# Patient Record
Sex: Female | Born: 1950 | Race: White | Hispanic: No | Smoking: Former smoker
Health system: Southern US, Community
[De-identification: ages and names within clinical notes are randomized; demographics above are authoritative.]

## PROBLEM LIST (undated history)

## (undated) DIAGNOSIS — R519 Headache, unspecified: Secondary | ICD-10-CM

## (undated) DIAGNOSIS — R51 Headache: Secondary | ICD-10-CM

## (undated) DIAGNOSIS — M549 Dorsalgia, unspecified: Secondary | ICD-10-CM

## (undated) DIAGNOSIS — M797 Fibromyalgia: Secondary | ICD-10-CM

## (undated) DIAGNOSIS — A6920 Lyme disease, unspecified: Secondary | ICD-10-CM

## (undated) HISTORY — DX: Headache, unspecified: R51.9

## (undated) HISTORY — DX: Lyme disease, unspecified: A69.20

## (undated) HISTORY — PX: ABDOMINAL HYSTERECTOMY: SHX81

## (undated) HISTORY — DX: Fibromyalgia: M79.7

## (undated) HISTORY — DX: Headache: R51

## (undated) HISTORY — PX: BREAST BIOPSY: SHX20

## (undated) HISTORY — DX: Dorsalgia, unspecified: M54.9

## (undated) HISTORY — PX: OTHER SURGICAL HISTORY: SHX169

## (undated) HISTORY — PX: CHOLECYSTECTOMY: SHX55

## (undated) HISTORY — PX: APPENDECTOMY: SHX54

---

## 1998-12-22 ENCOUNTER — Encounter: Admission: RE | Admit: 1998-12-22 | Discharge: 1999-03-22 | Payer: Self-pay | Admitting: Anesthesiology

## 2002-09-03 ENCOUNTER — Encounter: Admission: RE | Admit: 2002-09-03 | Discharge: 2002-09-03 | Payer: Self-pay | Admitting: Internal Medicine

## 2002-09-03 ENCOUNTER — Encounter: Payer: Self-pay | Admitting: Internal Medicine

## 2004-02-06 ENCOUNTER — Encounter: Payer: Self-pay | Admitting: Orthopedic Surgery

## 2004-03-23 ENCOUNTER — Ambulatory Visit: Payer: Self-pay | Admitting: Orthopedic Surgery

## 2004-05-04 ENCOUNTER — Ambulatory Visit: Payer: Self-pay | Admitting: Orthopedic Surgery

## 2008-09-17 ENCOUNTER — Ambulatory Visit: Payer: Self-pay | Admitting: Orthopedic Surgery

## 2008-09-17 DIAGNOSIS — IMO0002 Reserved for concepts with insufficient information to code with codable children: Secondary | ICD-10-CM

## 2008-09-17 DIAGNOSIS — M25569 Pain in unspecified knee: Secondary | ICD-10-CM

## 2008-09-20 ENCOUNTER — Encounter (INDEPENDENT_AMBULATORY_CARE_PROVIDER_SITE_OTHER): Payer: Self-pay | Admitting: *Deleted

## 2008-10-07 ENCOUNTER — Ambulatory Visit (HOSPITAL_COMMUNITY): Admission: RE | Admit: 2008-10-07 | Discharge: 2008-10-07 | Payer: Self-pay | Admitting: Orthopedic Surgery

## 2008-10-16 ENCOUNTER — Ambulatory Visit: Payer: Self-pay | Admitting: Orthopedic Surgery

## 2010-05-03 ENCOUNTER — Encounter: Payer: Self-pay | Admitting: Internal Medicine

## 2012-02-17 ENCOUNTER — Other Ambulatory Visit: Payer: Self-pay | Admitting: Internal Medicine

## 2012-02-17 DIAGNOSIS — Z1231 Encounter for screening mammogram for malignant neoplasm of breast: Secondary | ICD-10-CM

## 2012-03-13 ENCOUNTER — Ambulatory Visit
Admission: RE | Admit: 2012-03-13 | Discharge: 2012-03-13 | Disposition: A | Discharge status: Home / Self Care | Payer: Medicare Other | Source: Ambulatory Visit | Attending: Internal Medicine | Admitting: Internal Medicine

## 2012-03-13 DIAGNOSIS — Z1231 Encounter for screening mammogram for malignant neoplasm of breast: Secondary | ICD-10-CM

## 2012-10-05 DIAGNOSIS — R42 Dizziness and giddiness: Secondary | ICD-10-CM

## 2013-06-06 ENCOUNTER — Other Ambulatory Visit: Payer: Self-pay

## 2013-06-06 DIAGNOSIS — Z1231 Encounter for screening mammogram for malignant neoplasm of breast: Secondary | ICD-10-CM

## 2013-06-18 ENCOUNTER — Ambulatory Visit: Payer: Medicare Other

## 2014-05-20 ENCOUNTER — Ambulatory Visit (INDEPENDENT_AMBULATORY_CARE_PROVIDER_SITE_OTHER): Payer: No Typology Code available for payment source | Admitting: Psychiatry

## 2014-05-20 ENCOUNTER — Encounter (HOSPITAL_COMMUNITY): Payer: Self-pay | Admitting: Psychiatry

## 2014-05-20 VITALS — BP 120/58 | HR 86 | Ht 62.0 in | Wt 126.0 lb

## 2014-05-20 DIAGNOSIS — F332 Major depressive disorder, recurrent severe without psychotic features: Secondary | ICD-10-CM

## 2014-05-20 DIAGNOSIS — F411 Generalized anxiety disorder: Secondary | ICD-10-CM

## 2014-05-20 DIAGNOSIS — F331 Major depressive disorder, recurrent, moderate: Secondary | ICD-10-CM

## 2014-05-20 MED ORDER — ZOLPIDEM TARTRATE 10 MG PO TABS: 10.0000 mg | ORAL_TABLET | Freq: Every evening | ORAL | Status: DC | PRN

## 2014-05-20 MED ORDER — ALPRAZOLAM 1 MG PO TABS: 1.0000 mg | ORAL_TABLET | Freq: Three times a day (TID) | ORAL | Status: DC

## 2014-05-20 MED ORDER — DULOXETINE HCL 60 MG PO CPEP: 60.0000 mg | ORAL_CAPSULE | Freq: Every day | ORAL | Status: DC
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
## 2014-07-01 ENCOUNTER — Encounter (HOSPITAL_COMMUNITY): Payer: Self-pay | Admitting: *Deleted

## 2014-07-01 ENCOUNTER — Ambulatory Visit (HOSPITAL_COMMUNITY): Payer: Self-pay | Admitting: Psychiatry

## 2014-07-10 ENCOUNTER — Ambulatory Visit (INDEPENDENT_AMBULATORY_CARE_PROVIDER_SITE_OTHER): Payer: No Typology Code available for payment source | Admitting: Psychiatry

## 2014-07-10 ENCOUNTER — Encounter (HOSPITAL_COMMUNITY): Payer: Self-pay | Admitting: Psychiatry

## 2014-07-10 VITALS — BP 135/71 | HR 86 | Ht 62.0 in | Wt 117.8 lb

## 2014-07-10 DIAGNOSIS — F411 Generalized anxiety disorder: Secondary | ICD-10-CM | POA: Diagnosis not present

## 2014-07-10 DIAGNOSIS — F332 Major depressive disorder, recurrent severe without psychotic features: Secondary | ICD-10-CM

## 2014-07-10 DIAGNOSIS — F331 Major depressive disorder, recurrent, moderate: Secondary | ICD-10-CM

## 2014-07-10 NOTE — Patient Instructions (Signed)
Use viibryd dose pack as directed
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
## 2014-08-07 ENCOUNTER — Ambulatory Visit (INDEPENDENT_AMBULATORY_CARE_PROVIDER_SITE_OTHER): Payer: Medicare Other | Admitting: Psychiatry

## 2014-08-07 ENCOUNTER — Encounter (HOSPITAL_COMMUNITY): Payer: Self-pay | Admitting: Psychiatry

## 2014-08-07 VITALS — BP 121/67 | HR 82 | Ht 62.0 in | Wt 124.2 lb

## 2014-08-07 DIAGNOSIS — F331 Major depressive disorder, recurrent, moderate: Secondary | ICD-10-CM | POA: Diagnosis not present

## 2014-08-07 DIAGNOSIS — F411 Generalized anxiety disorder: Secondary | ICD-10-CM | POA: Diagnosis not present

## 2014-08-07 MED ORDER — ALPRAZOLAM 1 MG PO TABS: 1.0000 mg | ORAL_TABLET | Freq: Three times a day (TID) | ORAL | Status: DC

## 2014-08-07 MED ORDER — VILAZODONE HCL 40 MG PO TABS: 40.0000 mg | ORAL_TABLET | Freq: Every day | ORAL | Status: DC

## 2014-08-07 MED ORDER — ZOLPIDEM TARTRATE 10 MG PO TABS: 10.0000 mg | ORAL_TABLET | Freq: Every evening | ORAL | Status: DC | PRN
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
## 2014-08-29 ENCOUNTER — Telehealth (HOSPITAL_COMMUNITY): Payer: Self-pay | Admitting: *Deleted

## 2014-08-29 NOTE — Telephone Encounter (Signed)
Voicemail from pt stating that she just noticed that her Xanax has been changed. Per pt she usually receives 90  Tablets per month.

## 2014-08-29 NOTE — Telephone Encounter (Signed)
voice message from patient, just noticed her Xanax has been changed.   She usually get 90 a month.

## 2014-08-30 ENCOUNTER — Other Ambulatory Visit: Payer: Self-pay | Admitting: Psychiatry

## 2014-08-30 MED ORDER — ALPRAZOLAM 1 MG PO TABS: 1.0000 mg | ORAL_TABLET | Freq: Three times a day (TID) | ORAL | Status: DC

## 2014-08-30 NOTE — Telephone Encounter (Signed)
Corrected quantity printed

## 2014-09-03 NOTE — Telephone Encounter (Signed)
Pt is aware and stated she will come into office 09-04-14 to pick up script

## 2014-09-05 ENCOUNTER — Telehealth (HOSPITAL_COMMUNITY): Payer: Self-pay | Admitting: *Deleted

## 2014-09-05 NOTE — Telephone Encounter (Signed)
Pt came into office to pick up her printed script. Pt D/L number is [id number]. Pt agreed with printed script.

## 2014-10-04 ENCOUNTER — Encounter (HOSPITAL_COMMUNITY): Payer: Self-pay | Admitting: Psychiatry

## 2014-10-04 ENCOUNTER — Ambulatory Visit (INDEPENDENT_AMBULATORY_CARE_PROVIDER_SITE_OTHER): Payer: No Typology Code available for payment source | Admitting: Psychiatry

## 2014-10-04 VITALS — BP 120/60 | Ht 62.0 in | Wt 122.0 lb

## 2014-10-04 DIAGNOSIS — F331 Major depressive disorder, recurrent, moderate: Secondary | ICD-10-CM

## 2014-10-04 DIAGNOSIS — F411 Generalized anxiety disorder: Secondary | ICD-10-CM | POA: Diagnosis not present

## 2014-10-04 MED ORDER — VILAZODONE HCL 40 MG PO TABS: 40.0000 mg | ORAL_TABLET | Freq: Every day | ORAL | Status: DC

## 2014-10-04 MED ORDER — ZOLPIDEM TARTRATE 10 MG PO TABS: 10.0000 mg | ORAL_TABLET | Freq: Every evening | ORAL | Status: DC | PRN

## 2014-10-04 MED ORDER — ALPRAZOLAM 1 MG PO TABS: 1.0000 mg | ORAL_TABLET | Freq: Three times a day (TID) | ORAL | Status: DC
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
## 2014-10-07 ENCOUNTER — Ambulatory Visit (HOSPITAL_COMMUNITY): Payer: Self-pay | Admitting: Psychiatry

## 2015-01-03 ENCOUNTER — Ambulatory Visit (INDEPENDENT_AMBULATORY_CARE_PROVIDER_SITE_OTHER): Payer: No Typology Code available for payment source | Admitting: Psychiatry

## 2015-01-03 ENCOUNTER — Encounter (HOSPITAL_COMMUNITY): Payer: Self-pay | Admitting: Psychiatry

## 2015-01-03 VITALS — BP 140/86 | Ht 62.0 in | Wt 124.0 lb

## 2015-01-03 DIAGNOSIS — F332 Major depressive disorder, recurrent severe without psychotic features: Secondary | ICD-10-CM | POA: Diagnosis not present

## 2015-01-03 DIAGNOSIS — F411 Generalized anxiety disorder: Secondary | ICD-10-CM | POA: Diagnosis not present

## 2015-01-03 DIAGNOSIS — F331 Major depressive disorder, recurrent, moderate: Secondary | ICD-10-CM

## 2015-01-03 MED ORDER — ALPRAZOLAM 1 MG PO TABS: 1.0000 mg | ORAL_TABLET | Freq: Two times a day (BID) | ORAL | Status: DC

## 2015-01-03 MED ORDER — ZOLPIDEM TARTRATE 10 MG PO TABS: 10.0000 mg | ORAL_TABLET | Freq: Every evening | ORAL | Status: DC | PRN

## 2015-01-03 MED ORDER — VILAZODONE HCL 40 MG PO TABS: 40.0000 mg | ORAL_TABLET | Freq: Every day | ORAL | Status: DC
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
## 2015-03-04 ENCOUNTER — Ambulatory Visit (INDEPENDENT_AMBULATORY_CARE_PROVIDER_SITE_OTHER): Payer: No Typology Code available for payment source | Admitting: Psychiatry

## 2015-03-04 ENCOUNTER — Encounter (HOSPITAL_COMMUNITY): Payer: Self-pay | Admitting: Psychiatry

## 2015-03-04 VITALS — BP 115/82 | HR 83 | Ht 62.0 in | Wt 127.0 lb

## 2015-03-04 DIAGNOSIS — F331 Major depressive disorder, recurrent, moderate: Secondary | ICD-10-CM

## 2015-03-04 DIAGNOSIS — F411 Generalized anxiety disorder: Secondary | ICD-10-CM

## 2015-03-04 MED ORDER — DULOXETINE HCL 30 MG PO CPEP: 30.0000 mg | ORAL_CAPSULE | Freq: Every day | ORAL | Status: DC

## 2015-03-04 MED ORDER — ZOLPIDEM TARTRATE 10 MG PO TABS: 10.0000 mg | ORAL_TABLET | Freq: Every evening | ORAL | Status: DC | PRN

## 2015-03-04 MED ORDER — ALPRAZOLAM 1 MG PO TABS: 1.0000 mg | ORAL_TABLET | Freq: Two times a day (BID) | ORAL | Status: DC
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
## 2015-05-01 ENCOUNTER — Ambulatory Visit (HOSPITAL_COMMUNITY): Payer: Self-pay | Admitting: Psychiatry

## 2015-05-13 ENCOUNTER — Ambulatory Visit (HOSPITAL_COMMUNITY): Payer: Self-pay | Admitting: Psychiatry

## 2015-05-26 ENCOUNTER — Ambulatory Visit (HOSPITAL_COMMUNITY): Payer: Self-pay | Admitting: Psychiatry

## 2015-05-29 ENCOUNTER — Ambulatory Visit (INDEPENDENT_AMBULATORY_CARE_PROVIDER_SITE_OTHER): Payer: No Typology Code available for payment source | Admitting: Psychiatry

## 2015-05-29 ENCOUNTER — Encounter (HOSPITAL_COMMUNITY): Payer: Self-pay | Admitting: Psychiatry

## 2015-05-29 VITALS — BP 110/64 | HR 96 | Ht 62.0 in | Wt 128.2 lb

## 2015-05-29 DIAGNOSIS — F411 Generalized anxiety disorder: Secondary | ICD-10-CM

## 2015-05-29 DIAGNOSIS — F331 Major depressive disorder, recurrent, moderate: Secondary | ICD-10-CM | POA: Diagnosis not present

## 2015-05-29 MED ORDER — ZOLPIDEM TARTRATE 10 MG PO TABS: 10.0000 mg | ORAL_TABLET | Freq: Every evening | ORAL | Status: DC | PRN

## 2015-05-29 MED ORDER — ALPRAZOLAM 1 MG PO TABS: 1.0000 mg | ORAL_TABLET | Freq: Two times a day (BID) | ORAL | Status: DC

## 2015-05-29 MED ORDER — DULOXETINE HCL 30 MG PO CPEP: 30.0000 mg | ORAL_CAPSULE | Freq: Every day | ORAL | Status: DC
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
## 2015-08-26 ENCOUNTER — Encounter (HOSPITAL_COMMUNITY): Payer: Self-pay | Admitting: Psychiatry

## 2015-08-26 ENCOUNTER — Ambulatory Visit (INDEPENDENT_AMBULATORY_CARE_PROVIDER_SITE_OTHER): Payer: No Typology Code available for payment source | Admitting: Psychiatry

## 2015-08-26 VITALS — BP 129/72 | HR 88 | Ht 62.0 in | Wt 127.0 lb

## 2015-08-26 DIAGNOSIS — F411 Generalized anxiety disorder: Secondary | ICD-10-CM | POA: Diagnosis not present

## 2015-08-26 DIAGNOSIS — F331 Major depressive disorder, recurrent, moderate: Secondary | ICD-10-CM

## 2015-08-26 MED ORDER — ALPRAZOLAM 1 MG PO TABS: 1.0000 mg | ORAL_TABLET | Freq: Two times a day (BID) | ORAL | Status: DC

## 2015-08-26 MED ORDER — ZOLPIDEM TARTRATE 10 MG PO TABS: 10.0000 mg | ORAL_TABLET | Freq: Every evening | ORAL | Status: DC | PRN

## 2015-08-26 MED ORDER — DULOXETINE HCL 30 MG PO CPEP: 30.0000 mg | ORAL_CAPSULE | Freq: Every day | ORAL | Status: DC
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
## 2015-11-26 ENCOUNTER — Encounter (HOSPITAL_COMMUNITY): Payer: Self-pay | Admitting: Psychiatry

## 2015-11-26 ENCOUNTER — Ambulatory Visit (INDEPENDENT_AMBULATORY_CARE_PROVIDER_SITE_OTHER): Payer: No Typology Code available for payment source | Admitting: Psychiatry

## 2015-11-26 VITALS — BP 129/66 | HR 93 | Ht 62.0 in | Wt 123.4 lb

## 2015-11-26 DIAGNOSIS — F331 Major depressive disorder, recurrent, moderate: Secondary | ICD-10-CM

## 2015-11-26 MED ORDER — VILAZODONE HCL 40 MG PO TABS: 40.0000 mg | ORAL_TABLET | Freq: Every day | ORAL | 2 refills | Status: DC

## 2015-11-26 MED ORDER — ZOLPIDEM TARTRATE 10 MG PO TABS: 10.0000 mg | ORAL_TABLET | Freq: Every evening | ORAL | 2 refills | Status: DC | PRN

## 2015-11-26 MED ORDER — ALPRAZOLAM 1 MG PO TABS: 1.0000 mg | ORAL_TABLET | Freq: Two times a day (BID) | ORAL | 2 refills | Status: DC
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
## 2016-01-06 ENCOUNTER — Ambulatory Visit (INDEPENDENT_AMBULATORY_CARE_PROVIDER_SITE_OTHER): Payer: No Typology Code available for payment source | Admitting: Psychiatry

## 2016-01-06 ENCOUNTER — Encounter (HOSPITAL_COMMUNITY): Payer: Self-pay | Admitting: Psychiatry

## 2016-01-06 VITALS — BP 123/70 | HR 88 | Ht 62.0 in | Wt 122.8 lb

## 2016-01-06 DIAGNOSIS — F331 Major depressive disorder, recurrent, moderate: Secondary | ICD-10-CM | POA: Diagnosis not present

## 2016-01-06 MED ORDER — DULOXETINE HCL 30 MG PO CPEP: 30.0000 mg | ORAL_CAPSULE | Freq: Every day | ORAL | 2 refills | Status: DC

## 2016-01-06 MED ORDER — ALPRAZOLAM 1 MG PO TABS: 1.0000 mg | ORAL_TABLET | Freq: Three times a day (TID) | ORAL | 2 refills | Status: DC
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
## 2016-01-06 NOTE — Patient Instructions (Signed)
Try belsomra 15 mg at bedtime

## 2016-02-28 ENCOUNTER — Other Ambulatory Visit (HOSPITAL_COMMUNITY): Payer: Self-pay | Admitting: Psychiatry

## 2016-03-02 ENCOUNTER — Ambulatory Visit (HOSPITAL_COMMUNITY): Payer: Self-pay | Admitting: Psychiatry

## 2016-03-22 ENCOUNTER — Encounter (HOSPITAL_COMMUNITY): Payer: Self-pay | Admitting: Psychiatry

## 2016-03-22 ENCOUNTER — Ambulatory Visit (INDEPENDENT_AMBULATORY_CARE_PROVIDER_SITE_OTHER): Payer: No Typology Code available for payment source | Admitting: Psychiatry

## 2016-03-22 VITALS — BP 123/52 | HR 73 | Ht 62.0 in | Wt 125.4 lb

## 2016-03-22 DIAGNOSIS — F332 Major depressive disorder, recurrent severe without psychotic features: Secondary | ICD-10-CM

## 2016-03-22 DIAGNOSIS — F411 Generalized anxiety disorder: Secondary | ICD-10-CM

## 2016-03-22 DIAGNOSIS — Z818 Family history of other mental and behavioral disorders: Secondary | ICD-10-CM

## 2016-03-22 DIAGNOSIS — Z811 Family history of alcohol abuse and dependence: Secondary | ICD-10-CM

## 2016-03-22 DIAGNOSIS — Z79891 Long term (current) use of opiate analgesic: Secondary | ICD-10-CM | POA: Diagnosis not present

## 2016-03-22 DIAGNOSIS — F331 Major depressive disorder, recurrent, moderate: Secondary | ICD-10-CM

## 2016-03-22 MED ORDER — DULOXETINE HCL 30 MG PO CPEP: 30.0000 mg | ORAL_CAPSULE | Freq: Every day | ORAL | 2 refills | Status: DC

## 2016-03-22 MED ORDER — ALPRAZOLAM 1 MG PO TABS: 1.0000 mg | ORAL_TABLET | Freq: Three times a day (TID) | ORAL | 2 refills | Status: DC

## 2016-03-22 MED ORDER — ZOLPIDEM TARTRATE 10 MG PO TABS: 10.0000 mg | ORAL_TABLET | Freq: Every evening | ORAL | 2 refills | Status: DC | PRN
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
## 2016-05-31 ENCOUNTER — Other Ambulatory Visit (HOSPITAL_COMMUNITY): Payer: Self-pay | Admitting: Psychiatry

## 2016-06-02 ENCOUNTER — Telehealth (HOSPITAL_COMMUNITY): Payer: Self-pay | Admitting: *Deleted

## 2016-06-02 NOTE — Telephone Encounter (Signed)
Pt pharmacy [facility] is requesting refills for both pt Xanax TID and Ambien QHS. Both medications last printed on 03-22-2016 with 2 refills and pt was last seen the same day. Pt f/u appt with Dr. Tenny Crawoss is sch for 06-17-2016. Pt pharmacy number is [phone].

## 2016-06-02 NOTE — Telephone Encounter (Signed)
Patient should have enough refill until the next appointment. Will not prescribe refill at this time.

## 2016-06-02 NOTE — Telephone Encounter (Signed)
Message sent to Dr. Hisada and Dr. Ross.  

## 2016-06-02 NOTE — Telephone Encounter (Signed)
noted 

## 2016-06-17 ENCOUNTER — Ambulatory Visit (INDEPENDENT_AMBULATORY_CARE_PROVIDER_SITE_OTHER): Payer: No Typology Code available for payment source | Admitting: Psychiatry

## 2016-06-17 ENCOUNTER — Encounter (HOSPITAL_COMMUNITY): Payer: Self-pay | Admitting: Psychiatry

## 2016-06-17 VITALS — BP 136/79 | HR 76 | Ht 62.0 in | Wt 126.0 lb

## 2016-06-17 DIAGNOSIS — Z818 Family history of other mental and behavioral disorders: Secondary | ICD-10-CM

## 2016-06-17 DIAGNOSIS — Z79899 Other long term (current) drug therapy: Secondary | ICD-10-CM

## 2016-06-17 DIAGNOSIS — Z882 Allergy status to sulfonamides status: Secondary | ICD-10-CM | POA: Diagnosis not present

## 2016-06-17 DIAGNOSIS — F411 Generalized anxiety disorder: Secondary | ICD-10-CM | POA: Diagnosis not present

## 2016-06-17 DIAGNOSIS — Z888 Allergy status to other drugs, medicaments and biological substances status: Secondary | ICD-10-CM

## 2016-06-17 DIAGNOSIS — F331 Major depressive disorder, recurrent, moderate: Secondary | ICD-10-CM | POA: Diagnosis not present

## 2016-06-17 DIAGNOSIS — Z811 Family history of alcohol abuse and dependence: Secondary | ICD-10-CM

## 2016-06-17 MED ORDER — DULOXETINE HCL 30 MG PO CPEP
30.0000 mg | ORAL_CAPSULE | Freq: Every day | ORAL | 2 refills | Status: DC
Start: 2016-06-17 — End: 2016-09-15

## 2016-06-17 MED ORDER — ZOLPIDEM TARTRATE 10 MG PO TABS: 10.0000 mg | ORAL_TABLET | Freq: Every evening | ORAL | 2 refills | Status: DC | PRN

## 2016-06-17 MED ORDER — ALPRAZOLAM 1 MG PO TABS: 1.0000 mg | ORAL_TABLET | Freq: Three times a day (TID) | ORAL | 2 refills | Status: DC
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
## 2016-09-15 ENCOUNTER — Ambulatory Visit (INDEPENDENT_AMBULATORY_CARE_PROVIDER_SITE_OTHER): Payer: No Typology Code available for payment source | Admitting: Psychiatry

## 2016-09-15 ENCOUNTER — Encounter (HOSPITAL_COMMUNITY): Payer: Self-pay | Admitting: Psychiatry

## 2016-09-15 VITALS — BP 116/70 | Ht 62.0 in | Wt 124.0 lb

## 2016-09-15 DIAGNOSIS — Z811 Family history of alcohol abuse and dependence: Secondary | ICD-10-CM

## 2016-09-15 DIAGNOSIS — F331 Major depressive disorder, recurrent, moderate: Secondary | ICD-10-CM

## 2016-09-15 DIAGNOSIS — Z818 Family history of other mental and behavioral disorders: Secondary | ICD-10-CM | POA: Diagnosis not present

## 2016-09-15 DIAGNOSIS — F411 Generalized anxiety disorder: Secondary | ICD-10-CM

## 2016-09-15 MED ORDER — ZOLPIDEM TARTRATE 10 MG PO TABS
10.0000 mg | ORAL_TABLET | Freq: Every evening | ORAL | 3 refills | Status: DC | PRN
Start: 2016-09-15 — End: 2017-01-27

## 2016-09-15 MED ORDER — DULOXETINE HCL 30 MG PO CPEP: 30.0000 mg | ORAL_CAPSULE | Freq: Every day | ORAL | 3 refills | Status: DC

## 2016-09-15 MED ORDER — ALPRAZOLAM 1 MG PO TABS: 1.0000 mg | ORAL_TABLET | Freq: Three times a day (TID) | ORAL | 3 refills | Status: DC
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
## 2017-01-12 ENCOUNTER — Telehealth (HOSPITAL_COMMUNITY): Payer: Self-pay | Admitting: *Deleted

## 2017-01-12 NOTE — Telephone Encounter (Signed)
Called pt to resch her appt for 01-13-2017 due to provider being out of office. Staff unable to reach pt and lmtcb and office number provided. Called pt at [phone] and could not leave message due to voicemail box being full.

## 2017-01-13 ENCOUNTER — Ambulatory Visit (HOSPITAL_COMMUNITY): Payer: Self-pay | Admitting: Psychiatry

## 2017-01-27 ENCOUNTER — Ambulatory Visit (INDEPENDENT_AMBULATORY_CARE_PROVIDER_SITE_OTHER): Payer: No Typology Code available for payment source | Admitting: Psychiatry

## 2017-01-27 ENCOUNTER — Encounter (HOSPITAL_COMMUNITY): Payer: Self-pay | Admitting: Psychiatry

## 2017-01-27 VITALS — BP 138/75 | HR 75 | Ht 62.0 in | Wt 127.8 lb

## 2017-01-27 DIAGNOSIS — F331 Major depressive disorder, recurrent, moderate: Secondary | ICD-10-CM

## 2017-01-27 DIAGNOSIS — F332 Major depressive disorder, recurrent severe without psychotic features: Secondary | ICD-10-CM | POA: Diagnosis not present

## 2017-01-27 DIAGNOSIS — Z91411 Personal history of adult psychological abuse: Secondary | ICD-10-CM | POA: Diagnosis not present

## 2017-01-27 DIAGNOSIS — G479 Sleep disorder, unspecified: Secondary | ICD-10-CM

## 2017-01-27 DIAGNOSIS — Z79899 Other long term (current) drug therapy: Secondary | ICD-10-CM | POA: Diagnosis not present

## 2017-01-27 DIAGNOSIS — Z91410 Personal history of adult physical and sexual abuse: Secondary | ICD-10-CM | POA: Diagnosis not present

## 2017-01-27 DIAGNOSIS — M797 Fibromyalgia: Secondary | ICD-10-CM

## 2017-01-27 DIAGNOSIS — F411 Generalized anxiety disorder: Secondary | ICD-10-CM | POA: Diagnosis not present

## 2017-01-27 MED ORDER — ZOLPIDEM TARTRATE 10 MG PO TABS: 10.0000 mg | ORAL_TABLET | Freq: Every evening | ORAL | 3 refills | Status: DC | PRN

## 2017-01-27 MED ORDER — DULOXETINE HCL 30 MG PO CPEP: 30.0000 mg | ORAL_CAPSULE | Freq: Every day | ORAL | 3 refills | Status: DC

## 2017-01-27 MED ORDER — ALPRAZOLAM 1 MG PO TABS: 1.0000 mg | ORAL_TABLET | Freq: Three times a day (TID) | ORAL | 3 refills | Status: DC
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
## 2017-04-25 ENCOUNTER — Ambulatory Visit (HOSPITAL_COMMUNITY): Payer: Self-pay | Admitting: Psychiatry

## 2017-04-28 ENCOUNTER — Ambulatory Visit (HOSPITAL_COMMUNITY): Payer: No Typology Code available for payment source | Admitting: Psychiatry

## 2017-04-28 ENCOUNTER — Encounter (HOSPITAL_COMMUNITY): Payer: Self-pay | Admitting: Psychiatry

## 2017-04-28 VITALS — BP 126/72 | HR 75 | Ht 62.0 in | Wt 123.0 lb

## 2017-04-28 DIAGNOSIS — F331 Major depressive disorder, recurrent, moderate: Secondary | ICD-10-CM

## 2017-04-28 DIAGNOSIS — Z79899 Other long term (current) drug therapy: Secondary | ICD-10-CM

## 2017-04-28 DIAGNOSIS — Z87891 Personal history of nicotine dependence: Secondary | ICD-10-CM | POA: Diagnosis not present

## 2017-04-28 DIAGNOSIS — Z818 Family history of other mental and behavioral disorders: Secondary | ICD-10-CM

## 2017-04-28 DIAGNOSIS — Z811 Family history of alcohol abuse and dependence: Secondary | ICD-10-CM

## 2017-04-28 MED ORDER — ALPRAZOLAM 1 MG PO TABS: 1.0000 mg | ORAL_TABLET | Freq: Three times a day (TID) | ORAL | 3 refills | Status: DC

## 2017-04-28 MED ORDER — ZOLPIDEM TARTRATE 10 MG PO TABS: 10.0000 mg | ORAL_TABLET | Freq: Every evening | ORAL | 3 refills | Status: DC | PRN

## 2017-04-28 MED ORDER — DULOXETINE HCL 30 MG PO CPEP: 30.0000 mg | ORAL_CAPSULE | Freq: Every day | ORAL | 3 refills | Status: DC
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
## 2017-06-16 ENCOUNTER — Ambulatory Visit (INDEPENDENT_AMBULATORY_CARE_PROVIDER_SITE_OTHER): Payer: Medicare Other | Admitting: Otolaryngology

## 2017-06-16 DIAGNOSIS — J310 Chronic rhinitis: Secondary | ICD-10-CM

## 2017-06-16 DIAGNOSIS — H6123 Impacted cerumen, bilateral: Secondary | ICD-10-CM

## 2017-06-16 DIAGNOSIS — J343 Hypertrophy of nasal turbinates: Secondary | ICD-10-CM

## 2017-06-16 DIAGNOSIS — J342 Deviated nasal septum: Secondary | ICD-10-CM | POA: Diagnosis not present

## 2017-08-29 ENCOUNTER — Ambulatory Visit (HOSPITAL_COMMUNITY): Payer: No Typology Code available for payment source | Admitting: Psychiatry

## 2017-08-29 ENCOUNTER — Encounter (HOSPITAL_COMMUNITY): Payer: Self-pay | Admitting: Psychiatry

## 2017-08-29 VITALS — BP 122/74 | HR 94 | Ht 62.0 in | Wt 123.0 lb

## 2017-08-29 DIAGNOSIS — Z811 Family history of alcohol abuse and dependence: Secondary | ICD-10-CM

## 2017-08-29 DIAGNOSIS — Z79899 Other long term (current) drug therapy: Secondary | ICD-10-CM | POA: Diagnosis not present

## 2017-08-29 DIAGNOSIS — F419 Anxiety disorder, unspecified: Secondary | ICD-10-CM

## 2017-08-29 DIAGNOSIS — Z87891 Personal history of nicotine dependence: Secondary | ICD-10-CM | POA: Diagnosis not present

## 2017-08-29 DIAGNOSIS — Z818 Family history of other mental and behavioral disorders: Secondary | ICD-10-CM

## 2017-08-29 DIAGNOSIS — G4700 Insomnia, unspecified: Secondary | ICD-10-CM

## 2017-08-29 DIAGNOSIS — F331 Major depressive disorder, recurrent, moderate: Secondary | ICD-10-CM

## 2017-08-29 MED ORDER — DULOXETINE HCL 30 MG PO CPEP: 30.0000 mg | ORAL_CAPSULE | Freq: Every day | ORAL | 3 refills | Status: DC

## 2017-08-29 MED ORDER — ZOLPIDEM TARTRATE 10 MG PO TABS: 10.0000 mg | ORAL_TABLET | Freq: Every evening | ORAL | 3 refills | Status: DC | PRN

## 2017-08-29 MED ORDER — ALPRAZOLAM 1 MG PO TABS: 1.0000 mg | ORAL_TABLET | Freq: Three times a day (TID) | ORAL | 3 refills | Status: DC
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
## 2017-11-25 ENCOUNTER — Other Ambulatory Visit (HOSPITAL_COMMUNITY): Payer: Self-pay | Admitting: Psychiatry

## 2017-12-27 ENCOUNTER — Other Ambulatory Visit (HOSPITAL_COMMUNITY): Payer: Self-pay | Admitting: Psychiatry

## 2018-01-02 ENCOUNTER — Ambulatory Visit (HOSPITAL_COMMUNITY): Payer: Self-pay | Admitting: Psychiatry

## 2018-01-05 ENCOUNTER — Ambulatory Visit (HOSPITAL_COMMUNITY): Payer: No Typology Code available for payment source | Admitting: Psychiatry

## 2018-01-05 ENCOUNTER — Encounter (HOSPITAL_COMMUNITY): Payer: Self-pay | Admitting: Psychiatry

## 2018-01-05 VITALS — BP 114/71 | HR 71 | Ht 62.0 in | Wt 122.0 lb

## 2018-01-05 DIAGNOSIS — F331 Major depressive disorder, recurrent, moderate: Secondary | ICD-10-CM

## 2018-01-05 DIAGNOSIS — F419 Anxiety disorder, unspecified: Secondary | ICD-10-CM | POA: Diagnosis not present

## 2018-01-05 MED ORDER — ZOLPIDEM TARTRATE 10 MG PO TABS: 10.0000 mg | ORAL_TABLET | Freq: Every evening | ORAL | 0 refills | Status: DC | PRN

## 2018-01-05 MED ORDER — ALPRAZOLAM 1 MG PO TABS: 1.0000 mg | ORAL_TABLET | Freq: Three times a day (TID) | ORAL | 0 refills | Status: DC

## 2018-01-05 MED ORDER — DULOXETINE HCL 30 MG PO CPEP: 30.0000 mg | ORAL_CAPSULE | Freq: Every day | ORAL | 3 refills | Status: DC
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
## 2018-02-27 ENCOUNTER — Other Ambulatory Visit (HOSPITAL_COMMUNITY): Payer: Self-pay | Admitting: Psychiatry

## 2018-04-28 ENCOUNTER — Other Ambulatory Visit (HOSPITAL_COMMUNITY): Payer: Self-pay | Admitting: Psychiatry

## 2018-05-08 ENCOUNTER — Ambulatory Visit (HOSPITAL_COMMUNITY): Payer: No Typology Code available for payment source | Admitting: Psychiatry

## 2018-05-17 ENCOUNTER — Ambulatory Visit (HOSPITAL_COMMUNITY): Payer: Medicare Other | Admitting: Psychiatry

## 2018-05-17 ENCOUNTER — Encounter (HOSPITAL_COMMUNITY): Payer: Self-pay | Admitting: Psychiatry

## 2018-05-17 VITALS — BP 125/76 | HR 80 | Ht 62.0 in | Wt 129.0 lb

## 2018-05-17 DIAGNOSIS — F331 Major depressive disorder, recurrent, moderate: Secondary | ICD-10-CM | POA: Diagnosis not present

## 2018-05-17 MED ORDER — DULOXETINE HCL 30 MG PO CPEP: 30.0000 mg | ORAL_CAPSULE | Freq: Every day | ORAL | 3 refills | Status: DC

## 2018-05-17 MED ORDER — ZOLPIDEM TARTRATE 10 MG PO TABS: 10.0000 mg | ORAL_TABLET | Freq: Every evening | ORAL | 2 refills | Status: DC | PRN

## 2018-05-17 MED ORDER — ALPRAZOLAM 1 MG PO TABS: 1.0000 mg | ORAL_TABLET | Freq: Three times a day (TID) | ORAL | 2 refills | Status: DC
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
## 2018-07-27 ENCOUNTER — Other Ambulatory Visit (HOSPITAL_COMMUNITY): Payer: Self-pay | Admitting: Psychiatry

## 2018-09-18 ENCOUNTER — Ambulatory Visit (INDEPENDENT_AMBULATORY_CARE_PROVIDER_SITE_OTHER): Payer: Medicare Other | Admitting: Psychiatry

## 2018-09-18 ENCOUNTER — Encounter (HOSPITAL_COMMUNITY): Payer: Self-pay | Admitting: Psychiatry

## 2018-09-18 ENCOUNTER — Other Ambulatory Visit: Payer: Self-pay

## 2018-09-18 DIAGNOSIS — F331 Major depressive disorder, recurrent, moderate: Secondary | ICD-10-CM | POA: Diagnosis not present

## 2018-09-18 MED ORDER — DULOXETINE HCL 30 MG PO CPEP: 30.0000 mg | ORAL_CAPSULE | Freq: Every day | ORAL | 3 refills | Status: DC

## 2018-09-18 MED ORDER — ALPRAZOLAM 1 MG PO TABS: 1.0000 mg | ORAL_TABLET | Freq: Three times a day (TID) | ORAL | 3 refills | Status: DC

## 2018-09-18 MED ORDER — ZOLPIDEM TARTRATE 10 MG PO TABS: 10.0000 mg | ORAL_TABLET | Freq: Every evening | ORAL | 3 refills | Status: DC | PRN
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
## 2018-12-26 ENCOUNTER — Other Ambulatory Visit (HOSPITAL_COMMUNITY): Payer: Self-pay | Admitting: Psychiatry

## 2019-01-16 ENCOUNTER — Ambulatory Visit (INDEPENDENT_AMBULATORY_CARE_PROVIDER_SITE_OTHER): Payer: Medicare Other | Admitting: Psychiatry

## 2019-01-16 ENCOUNTER — Encounter (HOSPITAL_COMMUNITY): Payer: Self-pay | Admitting: Psychiatry

## 2019-01-16 ENCOUNTER — Other Ambulatory Visit: Payer: Self-pay

## 2019-01-16 DIAGNOSIS — F331 Major depressive disorder, recurrent, moderate: Secondary | ICD-10-CM

## 2019-01-16 MED ORDER — ZOLPIDEM TARTRATE 10 MG PO TABS: 10.0000 mg | ORAL_TABLET | Freq: Every evening | ORAL | 3 refills | Status: DC | PRN

## 2019-01-16 MED ORDER — DULOXETINE HCL 30 MG PO CPEP: 30.0000 mg | ORAL_CAPSULE | Freq: Every day | ORAL | 3 refills | Status: DC

## 2019-01-16 MED ORDER — ALPRAZOLAM 1 MG PO TABS: 1.0000 mg | ORAL_TABLET | Freq: Three times a day (TID) | ORAL | 3 refills | Status: DC
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
## 2019-01-22 ENCOUNTER — Ambulatory Visit (HOSPITAL_COMMUNITY): Payer: Medicare Other | Admitting: Psychiatry

## 2019-04-25 ENCOUNTER — Other Ambulatory Visit (HOSPITAL_COMMUNITY): Payer: Self-pay | Admitting: Psychiatry

## 2019-05-21 ENCOUNTER — Ambulatory Visit (INDEPENDENT_AMBULATORY_CARE_PROVIDER_SITE_OTHER): Payer: Medicare Other | Admitting: Psychiatry

## 2019-05-21 ENCOUNTER — Encounter (HOSPITAL_COMMUNITY): Payer: Self-pay | Admitting: Psychiatry

## 2019-05-21 ENCOUNTER — Other Ambulatory Visit: Payer: Self-pay

## 2019-05-21 DIAGNOSIS — F331 Major depressive disorder, recurrent, moderate: Secondary | ICD-10-CM | POA: Diagnosis not present

## 2019-05-21 MED ORDER — ALPRAZOLAM 1 MG PO TABS: 1.0000 mg | ORAL_TABLET | Freq: Three times a day (TID) | ORAL | 3 refills | Status: DC

## 2019-05-21 MED ORDER — ZOLPIDEM TARTRATE 10 MG PO TABS: 10.0000 mg | ORAL_TABLET | Freq: Every evening | ORAL | 3 refills | Status: DC | PRN

## 2019-05-21 MED ORDER — DULOXETINE HCL 30 MG PO CPEP: 30.0000 mg | ORAL_CAPSULE | Freq: Every day | ORAL | 3 refills | Status: DC
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
## 2019-09-17 ENCOUNTER — Ambulatory Visit (HOSPITAL_COMMUNITY): Payer: Medicare Other | Admitting: Psychiatry

## 2019-09-19 ENCOUNTER — Telehealth (INDEPENDENT_AMBULATORY_CARE_PROVIDER_SITE_OTHER): Payer: Medicare Other | Admitting: Psychiatry

## 2019-09-19 ENCOUNTER — Encounter (HOSPITAL_COMMUNITY): Payer: Self-pay | Admitting: Psychiatry

## 2019-09-19 ENCOUNTER — Other Ambulatory Visit: Payer: Self-pay

## 2019-09-19 DIAGNOSIS — F331 Major depressive disorder, recurrent, moderate: Secondary | ICD-10-CM

## 2019-09-19 MED ORDER — DULOXETINE HCL 30 MG PO CPEP: 30.0000 mg | ORAL_CAPSULE | Freq: Every day | ORAL | 3 refills | Status: DC

## 2019-09-19 MED ORDER — ALPRAZOLAM 1 MG PO TABS: 1.0000 mg | ORAL_TABLET | Freq: Three times a day (TID) | ORAL | 3 refills | Status: DC

## 2019-09-19 MED ORDER — ZOLPIDEM TARTRATE 10 MG PO TABS: 10.0000 mg | ORAL_TABLET | Freq: Every evening | ORAL | 3 refills | Status: DC | PRN
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
## 2019-12-21 ENCOUNTER — Other Ambulatory Visit (HOSPITAL_COMMUNITY): Payer: Self-pay | Admitting: Psychiatry

## 2020-01-16 ENCOUNTER — Other Ambulatory Visit: Payer: Self-pay

## 2020-01-16 ENCOUNTER — Encounter (HOSPITAL_COMMUNITY): Payer: Self-pay | Admitting: Psychiatry

## 2020-01-16 ENCOUNTER — Telehealth (INDEPENDENT_AMBULATORY_CARE_PROVIDER_SITE_OTHER): Payer: Medicare Other | Admitting: Psychiatry

## 2020-01-16 DIAGNOSIS — F331 Major depressive disorder, recurrent, moderate: Secondary | ICD-10-CM | POA: Diagnosis not present

## 2020-01-16 MED ORDER — ALPRAZOLAM 1 MG PO TABS: 1.0000 mg | ORAL_TABLET | Freq: Three times a day (TID) | ORAL | 3 refills | Status: DC

## 2020-01-16 MED ORDER — DULOXETINE HCL 30 MG PO CPEP: 30.0000 mg | ORAL_CAPSULE | Freq: Every day | ORAL | 3 refills | Status: DC

## 2020-01-16 MED ORDER — ZOLPIDEM TARTRATE 10 MG PO TABS: 10.0000 mg | ORAL_TABLET | Freq: Every evening | ORAL | 3 refills | Status: DC | PRN
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
## 2020-02-13 ENCOUNTER — Telehealth (HOSPITAL_COMMUNITY): Payer: Self-pay | Admitting: Psychiatry

## 2020-02-13 NOTE — Telephone Encounter (Signed)
Yes, Im aware

## 2020-02-13 NOTE — Telephone Encounter (Signed)
Open in error

## 2020-02-13 NOTE — Telephone Encounter (Signed)
Called patient to schedule f/u appt, patient advised not feeling will call back next week to schedule f/u. Pt wanted to advise Dr. Tenny Craw that a Ms. Leah Spencer passed away on Feb 21, 2023.

## 2020-05-16 ENCOUNTER — Encounter (HOSPITAL_COMMUNITY): Payer: Self-pay | Admitting: Psychiatry

## 2020-05-16 ENCOUNTER — Telehealth (INDEPENDENT_AMBULATORY_CARE_PROVIDER_SITE_OTHER): Payer: Medicare Other | Admitting: Psychiatry

## 2020-05-16 ENCOUNTER — Other Ambulatory Visit: Payer: Self-pay

## 2020-05-16 DIAGNOSIS — F331 Major depressive disorder, recurrent, moderate: Secondary | ICD-10-CM | POA: Diagnosis not present

## 2020-05-16 MED ORDER — DULOXETINE HCL 30 MG PO CPEP: 30.0000 mg | ORAL_CAPSULE | Freq: Every day | ORAL | 3 refills | Status: DC

## 2020-05-16 MED ORDER — ALPRAZOLAM 1 MG PO TABS: 1.0000 mg | ORAL_TABLET | Freq: Three times a day (TID) | ORAL | 3 refills | Status: DC

## 2020-05-16 MED ORDER — ZOLPIDEM TARTRATE 10 MG PO TABS: 10.0000 mg | ORAL_TABLET | Freq: Every evening | ORAL | 3 refills | Status: DC | PRN

## 2020-05-16 NOTE — Progress Notes (Signed)
Virtual Visit via Telephone Note  I connected with [name] on 05/16/20 at 11:00 AM EST by telephone and verified that I am speaking with the correct person using two identifiers.  Location: Patient: home Provider: home   I discussed the limitations, risks, security and privacy concerns of performing an evaluation and management service by telephone and the availability of in person appointments. I also discussed with the patient that there may be a patient responsible charge related to this service. The patient expressed understanding and agreed to proceed.   I discussed the assessment and treatment plan with the patient. The patient was provided an opportunity to ask questions and all were answered. The patient agreed with the plan and demonstrated an understanding of the instructions.   The patient was advised to call back or seek an in-person evaluation if the symptoms worsen or if the condition fails to improve as anticipated.  I provided 15 minutes of non-face-to-face time during this encounter.   Diannia Ruder, MD  [facility] MD/PA/NP OP Progress Note  05/16/2020 11:33 AM [name]  MRN:  [id number]  Chief Complaint:  Chief Complaint    Anxiety; Depression; Follow-up     HPI: this patient is a 70 year old divorced white female who lives alone in [place]. She has one daughter and 2 grandchildren. She worked in [employer] most of her life but when out in 2004 on disability due to bulging disc in her back  The patient returns for follow-up after 4 months.  Unfortunately she lost one of her sisters to cancer back in October.  She had 2 of her other sisters took care of the sister while she was dying at home.  She was grateful that she was able to do this and provide comfort.  Yet she still feels very sad about it.  She has had more trouble sleeping and part of this is due to her fibromyalgia.  She is taking Ambien and Xanax at night and I warned her about 2 not overdo it given her  age.  She denies any oversedation.  She knows not to take any pain medication in conjunction with these meds.  She states overall her mood is stable despite these recent losses.  Visit Diagnosis:    ICD-10-CM   1. Major depressive disorder, recurrent episode, moderate (HCC)  F33.1     Past Psychiatric History: Long-term outpatient treatment for depression  Past Medical History:  Past Medical History:  Diagnosis Date  . Back pain   . Fibromyalgia   . Headache   . Lyme disease     Past Surgical History:  Procedure Laterality Date  . ABDOMINAL HYSTERECTOMY    . APPENDECTOMY    . CHOLECYSTECTOMY    . right elbow surgery      Family Psychiatric History: see below  Family History:  Family History  Problem Relation Age of Onset  . Depression Mother   . Alcohol abuse Father   . Depression Sister   . Depression Maternal Aunt   . Depression Sister   . Depression Sister     Social History:  Social History   Socioeconomic History  . Marital status: Divorced    Spouse name: Not on file  . Number of children: Not on file  . Years of education: Not on file  . Highest education level: Not on file  Occupational History  . Not on file  Tobacco Use  . Smoking status: Former Games developer  . Smokeless tobacco: Never Used  .  Tobacco comment: Quit in 1983  Substance and Sexual Activity  . Alcohol use: No  . Drug use: No  . Sexual activity: Not Currently  Other Topics Concern  . Not on file  Social History Narrative  . Not on file   Social Determinants of Health   Financial Resource Strain: Not on file  Food Insecurity: Not on file  Transportation Needs: Not on file  Physical Activity: Not on file  Stress: Not on file  Social Connections: Not on file    Allergies:  Allergies  Allergen Reactions  . Codeine     Itchy and jittery  . Elemental Sulfur     Metabolic Disorder Labs: No results found for: HGBA1C, MPG No results found for: PROLACTIN No results found for:  CHOL, TRIG, HDL, CHOLHDL, VLDL, LDLCALC No results found for: TSH  Therapeutic Level Labs: No results found for: LITHIUM No results found for: VALPROATE No components found for:  CBMZ  Current Medications: Current Outpatient Medications  Medication Sig Dispense Refill  . ALPRAZolam (XANAX) 1 MG tablet Take 1 tablet (1 mg total) by mouth 3 (three) times daily. 90 tablet 3  . Cyanocobalamin (VITAMIN B-12 IJ) Inject as directed every 30 (thirty) days.    . DULoxetine (CYMBALTA) 30 MG capsule Take 1 capsule (30 mg total) by mouth daily. 30 capsule 3  . oxyCODONE-acetaminophen (PERCOCET) 7.5-325 MG per tablet Take 1 tablet by mouth 3 (three) times daily as needed.  0  . zolpidem (AMBIEN) 10 MG tablet Take 1 tablet (10 mg total) by mouth at bedtime as needed. for sleep 30 tablet 3   No current facility-administered medications for this visit.     Musculoskeletal: Strength & Muscle Tone: within normal limits Gait & Station: normal Patient leans: N/A  Psychiatric Specialty Exam: Review of Systems  Musculoskeletal: Positive for arthralgias and myalgias.  Psychiatric/Behavioral: Positive for sleep disturbance.  All other systems reviewed and are negative.   There were no vitals taken for this visit.There is no height or weight on file to calculate BMI.  General Appearance: NA  Eye Contact:  NA  Speech:  Clear and Coherent  Volume:  Normal  Mood:  Dysphoric  Affect:  NA  Thought Process:  Goal Directed  Orientation:  Full (Time, Place, and Person)  Thought Content: Rumination   Suicidal Thoughts:  No  Homicidal Thoughts:  No  Memory:  Immediate;   Good Recent;   Good Remote;   Fair  Judgement:  Good  Insight:  Good  Psychomotor Activity:  Decreased  Concentration:  Concentration: Good and Attention Span: Good  Recall:  Good  Fund of Knowledge: Good  Language: Good  Akathisia:  No  Handed:  Right  AIMS (if indicated): not done  Assets:  Communication Skills Desire for  Improvement Resilience Social Support Talents/Skills  ADL's:  Intact  Cognition: WNL  Sleep:  Fair   Screenings:   Assessment and Plan: This patient is a 70 year old female with a history of depression and anxiety.  She continues to do fairly well despite the recent loss of her sister.  She is up not sleeping well all the time but some of this is due to fibromyalgia.  For now she will continue Xanax 1 mg up to 3 times daily for anxiety, Cymbalta 30 mg daily for depression and Ambien 10 mg at bedtime as needed for sleep.  She will return to see me in 3 months   Diannia Ruder, MD 05/16/2020, 11:33 AM

## 2020-08-22 ENCOUNTER — Other Ambulatory Visit (HOSPITAL_COMMUNITY): Payer: Self-pay | Admitting: Psychiatry

## 2020-09-10 ENCOUNTER — Telehealth (INDEPENDENT_AMBULATORY_CARE_PROVIDER_SITE_OTHER): Payer: Medicare Other | Admitting: Psychiatry

## 2020-09-10 ENCOUNTER — Telehealth (HOSPITAL_COMMUNITY): Payer: Medicare Other | Admitting: Psychiatry

## 2020-09-10 ENCOUNTER — Encounter (HOSPITAL_COMMUNITY): Payer: Self-pay | Admitting: Psychiatry

## 2020-09-10 ENCOUNTER — Other Ambulatory Visit: Payer: Self-pay

## 2020-09-10 DIAGNOSIS — F331 Major depressive disorder, recurrent, moderate: Secondary | ICD-10-CM | POA: Diagnosis not present

## 2020-09-10 MED ORDER — VENLAFAXINE HCL ER 75 MG PO CP24: 75.0000 mg | ORAL_CAPSULE | Freq: Every day | ORAL | 2 refills | Status: DC

## 2020-09-10 MED ORDER — ALPRAZOLAM 1 MG PO TABS: 1.0000 mg | ORAL_TABLET | Freq: Three times a day (TID) | ORAL | 3 refills | Status: DC

## 2020-09-10 MED ORDER — ZOLPIDEM TARTRATE 10 MG PO TABS: 10.0000 mg | ORAL_TABLET | Freq: Every evening | ORAL | 3 refills | Status: DC | PRN

## 2020-09-10 NOTE — Progress Notes (Signed)
Virtual Visit via Telephone Note  I connected with Leah Spencer on 09/10/20 at 11:40 AM EDT by telephone and verified that I am speaking with the correct person using two identifiers.  Location: Patient: home Provider: home office   I discussed the limitations, risks, security and privacy concerns of performing an evaluation and management service by telephone and the availability of in person appointments. I also discussed with the patient that there may be a patient responsible charge related to this service. The patient expressed understanding and agreed to proceed.     I discussed the assessment and treatment plan with the patient. The patient was provided an opportunity to ask questions and all were answered. The patient agreed with the plan and demonstrated an understanding of the instructions.   The patient was advised to call back or seek an in-person evaluation if the symptoms worsen or if the condition fails to improve as anticipated.  I provided 15 minutes of non-face-to-face time during this encounter.   Leah Ruder, MD  [facility] MD/PA/NP OP Progress Note  09/10/2020 12:07 PM Leah Spencer  MRN:  [id number]  Chief Complaint:  Chief Complaint    Anxiety; Depression; Follow-up     HPI:  this patient is a70 year old divorced white female who lives alone in [place]. She has one daughter and 2 grandchildren. She worked in [employer] most of her life but when out in 2004 on disability due to bulging disc in her back  The patient returns for follow-up after 3 months.  She states that she stopped the Cymbalta because it was giving her stomachaches.  She does not think it was helping she even claimed that she had a few more suicidal thoughts while on it.  She asked if she can get back on Effexor XR that she took years ago and I think this is reasonable.  She is having a lot of chronic back pain and is worsening going down her legs etc.  I urged her to talk to her primary care  physician about this.  She has been getting injections periodically.  She is sleeping okay and the Xanax continues to help her anxiety.  But Visit Diagnosis:    ICD-10-CM   1. Major depressive disorder, recurrent episode, moderate (HCC)  F33.1     Past Psychiatric History: Long-term outpatient treatment for depression  Past Medical History:  Past Medical History:  Diagnosis Date  . Back pain   . Fibromyalgia   . Headache   . Lyme disease     Past Surgical History:  Procedure Laterality Date  . ABDOMINAL HYSTERECTOMY    . APPENDECTOMY    . CHOLECYSTECTOMY    . right elbow surgery      Family Psychiatric History: see below  Family History:  Family History  Problem Relation Age of Onset  . Depression Mother   . Alcohol abuse Father   . Depression Sister   . Depression Maternal Aunt   . Depression Sister   . Depression Sister     Social History:  Social History   Socioeconomic History  . Marital status: Divorced    Spouse name: Not on file  . Number of children: Not on file  . Years of education: Not on file  . Highest education level: Not on file  Occupational History  . Not on file  Tobacco Use  . Smoking status: Former Games developer  . Smokeless tobacco: Never Used  . Tobacco comment: Quit in 1983  Substance and  Sexual Activity  . Alcohol use: No  . Drug use: No  . Sexual activity: Not Currently  Other Topics Concern  . Not on file  Social History Narrative  . Not on file   Social Determinants of Health   Financial Resource Strain: Not on file  Food Insecurity: Not on file  Transportation Needs: Not on file  Physical Activity: Not on file  Stress: Not on file  Social Connections: Not on file    Allergies:  Allergies  Allergen Reactions  . Codeine     Itchy and jittery  . Elemental Sulfur     Metabolic Disorder Labs: No results found for: HGBA1C, MPG No results found for: PROLACTIN No results found for: CHOL, TRIG, HDL, CHOLHDL, VLDL,  LDLCALC No results found for: TSH  Therapeutic Level Labs: No results found for: LITHIUM No results found for: VALPROATE No components found for:  CBMZ  Current Medications: Current Outpatient Medications  Medication Sig Dispense Refill  . venlafaxine XR (EFFEXOR XR) 75 MG 24 hr capsule Take 1 capsule (75 mg total) by mouth daily. 30 capsule 2  . ALPRAZolam (XANAX) 1 MG tablet Take 1 tablet (1 mg total) by mouth 3 (three) times daily. 90 tablet 3  . Cyanocobalamin (VITAMIN B-12 IJ) Inject as directed every 30 (thirty) days.    Marland Kitchen oxyCODONE-acetaminophen (PERCOCET) 7.5-325 MG per tablet Take 1 tablet by mouth 3 (three) times daily as needed.  0  . zolpidem (AMBIEN) 10 MG tablet Take 1 tablet (10 mg total) by mouth at bedtime as needed. for sleep 30 tablet 3   No current facility-administered medications for this visit.     Musculoskeletal: Strength & Muscle Tone: within normal limits Gait & Station: normal Patient leans: N/A  Psychiatric Specialty Exam: Review of Systems  Constitutional: Positive for fatigue.  Musculoskeletal: Positive for back pain and myalgias.  Psychiatric/Behavioral: The patient is nervous/anxious.   All other systems reviewed and are negative.   There were no vitals taken for this visit.There is no height or weight on file to calculate BMI.  General Appearance: NA  Eye Contact:  NA  Speech:  Clear and Coherent  Volume:  Normal  Mood:  Anxious and Dysphoric  Affect:  NA  Thought Process:  Goal Directed  Orientation:  Full (Time, Place, and Person)  Thought Content: Rumination   Suicidal Thoughts:  No  Homicidal Thoughts:  No  Memory:  Immediate;   Good Recent;   Good Remote;   Good  Judgement:  Good  Insight:  Fair  Psychomotor Activity:  Decreased  Concentration:  Concentration: Good and Attention Span: Good  Recall:  Good  Fund of Knowledge: Good  Language: Good  Akathisia:  No  Handed:  Right  AIMS (if indicated): not done  Assets:   Communication Skills Desire for Improvement Resilience Social Support Talents/Skills  ADL's:  Intact  Cognition: WNL  Sleep:  Fair   Screenings: PHQ2-9   Flowsheet Row Video Visit [facility] 09[facility]10/2020 [facility]  PHQ-2 Total Score 0    Flowsheet Row Video Visit from 09/10/2020 in [facility]  C-SSRS RISK CATEGORY No Risk       Assessment and Plan: This patient is a 70 year old female with a history of depression and anxiety.  She has stopped the Cymbalta due to side effects but would like to try Effexor XR 75 mg daily which I think is reasonable.  She will also continue Xanax 1 mg up  to 3 times for anxiety and Ambien 10 mg at bedtime as needed for sleep.  She will return to see me in 3 months   Leah Ruder, MD 09/10/2020, 12:07 PM

## 2020-11-14 ENCOUNTER — Other Ambulatory Visit (HOSPITAL_COMMUNITY): Payer: Self-pay | Admitting: Psychiatry

## 2020-12-11 ENCOUNTER — Other Ambulatory Visit: Payer: Self-pay

## 2020-12-11 ENCOUNTER — Encounter (HOSPITAL_COMMUNITY): Payer: Self-pay | Admitting: Psychiatry

## 2020-12-11 ENCOUNTER — Telehealth (INDEPENDENT_AMBULATORY_CARE_PROVIDER_SITE_OTHER): Payer: Medicare Other | Admitting: Psychiatry

## 2020-12-11 DIAGNOSIS — F331 Major depressive disorder, recurrent, moderate: Secondary | ICD-10-CM | POA: Diagnosis not present

## 2020-12-11 MED ORDER — ESCITALOPRAM OXALATE 20 MG PO TABS: 20.0000 mg | ORAL_TABLET | Freq: Every day | ORAL | 2 refills | Status: DC

## 2020-12-11 MED ORDER — ZOLPIDEM TARTRATE 10 MG PO TABS: 10.0000 mg | ORAL_TABLET | Freq: Every evening | ORAL | 3 refills | Status: DC | PRN

## 2020-12-11 MED ORDER — ALPRAZOLAM 1 MG PO TABS: 1.0000 mg | ORAL_TABLET | Freq: Three times a day (TID) | ORAL | 3 refills | Status: DC

## 2020-12-11 NOTE — Progress Notes (Signed)
Virtual Visit via Telephone Note  I connected with Leah Spencer on 12/11/20 at 10:00 AM EDT by telephone and verified that I am speaking with the correct person using two identifiers.  Location: Patient: home Provider: office   I discussed the limitations, risks, security and privacy concerns of performing an evaluation and management service by telephone and the availability of in person appointments. I also discussed with the patient that there may be a patient responsible charge related to this service. The patient expressed understanding and agreed to proceed.      I discussed the assessment and treatment plan with the patient. The patient was provided an opportunity to ask questions and all were answered. The patient agreed with the plan and demonstrated an understanding of the instructions.   The patient was advised to call back or seek an in-person evaluation if the symptoms worsen or if the condition fails to improve as anticipated.  I provided 14 minutes of non-face-to-face time during this encounter.   Diannia Ruder, MD  [facility] MD/PA/NP OP Progress Note  12/11/2020 10:26 AM Leah Spencer  MRN:  [id number]  Chief Complaint:  Chief Complaint   Depression; Anxiety; Follow-up    HPI: this patient is a 70 year old divorced white female who lives alone in [place]. She has one daughter and 2 grandchildren. She worked in [employer] most of her life but when out in 2004 on disability due to bulging disc in her back  The patient returns for follow-up after 3 months.  She states that for the most part she is doing okay.  She asked to be put back on Effexor XR but is causing headache.  It may have helped her depression a little bit but she does not like the side effects.  I suggest that we try Lexapro which may have less side effects.  She is still dealing with chronic back pain but is on medication to help with this.  She is enjoying spending time with her children and grandchildren.   She is sleeping well with the Ambien and the Xanax continues to help her anxiety Visit Diagnosis:    ICD-10-CM   1. Major depressive disorder, recurrent episode, moderate (HCC)  F33.1       Past Psychiatric History: Long-term outpatient treatment for depression  Past Medical History:  Past Medical History:  Diagnosis Date   Back pain    Fibromyalgia    Headache    Lyme disease     Past Surgical History:  Procedure Laterality Date   ABDOMINAL HYSTERECTOMY     APPENDECTOMY     CHOLECYSTECTOMY     right elbow surgery      Family Psychiatric History: see below  Family History:  Family History  Problem Relation Age of Onset   Depression Mother    Alcohol abuse Father    Depression Sister    Depression Maternal Aunt    Depression Sister    Depression Sister     Social History:  Social History   Socioeconomic History   Marital status: Divorced    Spouse name: Not on file   Number of children: Not on file   Years of education: Not on file   Highest education level: Not on file  Occupational History   Not on file  Tobacco Use   Smoking status: Former   Smokeless tobacco: Never   Tobacco comments:    Quit in 1983  Substance and Sexual Activity   Alcohol use: No  Drug use: No   Sexual activity: Not Currently  Other Topics Concern   Not on file  Social History Narrative   Not on file   Social Determinants of Health   Financial Resource Strain: Not on file  Food Insecurity: Not on file  Transportation Needs: Not on file  Physical Activity: Not on file  Stress: Not on file  Social Connections: Not on file    Allergies:  Allergies  Allergen Reactions   Codeine     Itchy and jittery   Elemental Sulfur     Metabolic Disorder Labs: No results found for: HGBA1C, MPG No results found for: PROLACTIN No results found for: CHOL, TRIG, HDL, CHOLHDL, VLDL, LDLCALC No results found for: TSH  Therapeutic Level Labs: No results found for: LITHIUM No  results found for: VALPROATE No components found for:  CBMZ  Current Medications: Current Outpatient Medications  Medication Sig Dispense Refill   escitalopram (LEXAPRO) 20 MG tablet Take 1 tablet (20 mg total) by mouth daily. 30 tablet 2   ALPRAZolam (XANAX) 1 MG tablet Take 1 tablet (1 mg total) by mouth 3 (three) times daily. 90 tablet 3   Cyanocobalamin (VITAMIN B-12 IJ) Inject as directed every 30 (thirty) days.     oxyCODONE-acetaminophen (PERCOCET) 7.5-325 MG per tablet Take 1 tablet by mouth 3 (three) times daily as needed.  0   zolpidem (AMBIEN) 10 MG tablet Take 1 tablet (10 mg total) by mouth at bedtime as needed. for sleep 30 tablet 3   No current facility-administered medications for this visit.     Musculoskeletal: Strength & Muscle Tone: na Gait & Station: na Patient leans: N/A  Psychiatric Specialty Exam: Review of Systems  Musculoskeletal:  Positive for arthralgias, back pain and myalgias.  All other systems reviewed and are negative.  There were no vitals taken for this visit.There is no height or weight on file to calculate BMI.  General Appearance: NA  Eye Contact:  NA  Speech:  Clear and Coherent  Volume:  Normal  Mood:  Dysphoric  Affect:  NA  Thought Process:  Goal Directed  Orientation:  Full (Time, Place, and Person)  Thought Content: Rumination   Suicidal Thoughts:  No  Homicidal Thoughts:  No  Memory:  Immediate;   Good Recent;   Good Remote;   Good  Judgement:  Good  Insight:  Good  Psychomotor Activity:  Decreased  Concentration:  Concentration: Good and Attention Span: Good  Recall:  Good  Fund of Knowledge: Good  Language: Good  Akathisia:  No  Handed:  Right  AIMS (if indicated): not done  Assets:  Communication Skills Desire for Improvement Resilience Social Support Talents/Skills  ADL's:  Intact  Cognition: WNL  Sleep:  Good   Screenings: PHQ2-9    Flowsheet Row [facility] Video Visit from 09/10/2020 in [facility]  PHQ-2 Total Score 0 0      Flowsheet Row Video Visit from 12/11/2020 in [facility] Video Visit from 09/10/2020 in [facility]  C-SSRS RISK CATEGORY No Risk No Risk        Assessment and Plan: This patient is a 70 year old female with a history of depression and anxiety.  She is not happy with the Effexor XR because it is causing headaches.  We will switch to Lexapro 20 mg daily.  She will continue Xanax 1 mg 3 times daily for anxiety  and Ambien 10 mg at bedtime as needed for sleep.  She will return to see me in 3 months   Diannia Ruder, MD 12/11/2020, 10:26 AM

## 2021-01-15 ENCOUNTER — Other Ambulatory Visit (HOSPITAL_COMMUNITY): Payer: Self-pay | Admitting: Psychiatry

## 2021-01-19 ENCOUNTER — Other Ambulatory Visit: Payer: Self-pay | Admitting: Internal Medicine

## 2021-01-19 DIAGNOSIS — Z139 Encounter for screening, unspecified: Secondary | ICD-10-CM

## 2021-01-20 ENCOUNTER — Inpatient Hospital Stay: Admission: RE | Admit: 2021-01-20 | Payer: Medicare Other | Source: Ambulatory Visit

## 2021-03-12 ENCOUNTER — Encounter (HOSPITAL_COMMUNITY): Payer: Self-pay | Admitting: Psychiatry

## 2021-03-12 ENCOUNTER — Other Ambulatory Visit: Payer: Self-pay

## 2021-03-12 ENCOUNTER — Telehealth (INDEPENDENT_AMBULATORY_CARE_PROVIDER_SITE_OTHER): Payer: Medicare Other | Admitting: Psychiatry

## 2021-03-12 ENCOUNTER — Telehealth (HOSPITAL_COMMUNITY): Payer: Medicare Other | Admitting: Psychiatry

## 2021-03-12 DIAGNOSIS — F331 Major depressive disorder, recurrent, moderate: Secondary | ICD-10-CM

## 2021-03-12 MED ORDER — ESCITALOPRAM OXALATE 20 MG PO TABS: 20.0000 mg | ORAL_TABLET | Freq: Every day | ORAL | 2 refills | Status: DC

## 2021-03-12 MED ORDER — ZOLPIDEM TARTRATE 10 MG PO TABS: 10.0000 mg | ORAL_TABLET | Freq: Every evening | ORAL | 3 refills | Status: DC | PRN

## 2021-03-12 MED ORDER — ALPRAZOLAM 1 MG PO TABS: 1.0000 mg | ORAL_TABLET | Freq: Three times a day (TID) | ORAL | 3 refills | Status: DC

## 2021-03-12 NOTE — Progress Notes (Signed)
Virtual Visit via Telephone Note  I connected with Leah Spencer on 03/12/21 at  1:20 PM EST by telephone and verified that I am speaking with the correct person using two identifiers.  Location: Patient: home Provider: office   I discussed the limitations, risks, security and privacy concerns of performing an evaluation and management service by telephone and the availability of in person appointments. I also discussed with the patient that there may be a patient responsible charge related to this service. The patient expressed understanding and agreed to proceed.      I discussed the assessment and treatment plan with the patient. The patient was provided an opportunity to ask questions and all were answered. The patient agreed with the plan and demonstrated an understanding of the instructions.   The patient was advised to call back or seek an in-person evaluation if the symptoms worsen or if the condition fails to improve as anticipated.  I provided 12 minutes of non-face-to-face time during this encounter.   Diannia Ruder, MD  [facility] MD/PA/NP OP Progress Note  03/12/2021 1:42 PM Leah Spencer  MRN:  [id number]  Chief Complaint:  Chief Complaint   Depression; Anxiety; Follow-up    HPI: this patient is a 70 year old divorced white female who lives alone in [place]. She has one daughter and 2 grandchildren. She worked in [employer] most of her life but when out in 2004 on disability due to bulging disc in her back  The patient returns for follow-up after 3 months.  She states she is generally doing okay we had switched from Effexor XR to Lexapro last time and she thinks it is helping a little bit.  Her sister is granddaughter recently was found died due to a drug overdose and this is gotten her down.  However she is trying to stay positive.  She continues to deal with chronic back pain which seems to be getting worse.  I suggested that she get a referral to pain management or  physical therapy but she does not seem that interested.  She is sleeping well with the Ambien most of the time and the Xanax continues to help her anxiety. Visit Diagnosis:    ICD-10-CM   1. Major depressive disorder, recurrent episode, moderate (HCC)  F33.1       Past Psychiatric History: Long-term outpatient treatment for depression  Past Medical History:  Past Medical History:  Diagnosis Date   Back pain    Fibromyalgia    Headache    Lyme disease     Past Surgical History:  Procedure Laterality Date   ABDOMINAL HYSTERECTOMY     APPENDECTOMY     CHOLECYSTECTOMY     right elbow surgery      Family Psychiatric History: see below  Family History:  Family History  Problem Relation Age of Onset   Depression Mother    Alcohol abuse Father    Depression Sister    Depression Maternal Aunt    Depression Sister    Depression Sister     Social History:  Social History   Socioeconomic History   Marital status: Divorced    Spouse name: Not on file   Number of children: Not on file   Years of education: Not on file   Highest education level: Not on file  Occupational History   Not on file  Tobacco Use   Smoking status: Former   Smokeless tobacco: Never   Tobacco comments:    Quit in 47  Substance and Sexual Activity   Alcohol use: No   Drug use: No   Sexual activity: Not Currently  Other Topics Concern   Not on file  Social History Narrative   Not on file   Social Determinants of Health   Financial Resource Strain: Not on file  Food Insecurity: Not on file  Transportation Needs: Not on file  Physical Activity: Not on file  Stress: Not on file  Social Connections: Not on file    Allergies:  Allergies  Allergen Reactions   Codeine     Itchy and jittery   Elemental Sulfur     Metabolic Disorder Labs: No results found for: HGBA1C, MPG No results found for: PROLACTIN No results found for: CHOL, TRIG, HDL, CHOLHDL, VLDL, LDLCALC No results found  for: TSH  Therapeutic Level Labs: No results found for: LITHIUM No results found for: VALPROATE No components found for:  CBMZ  Current Medications: Current Outpatient Medications  Medication Sig Dispense Refill   ALPRAZolam (XANAX) 1 MG tablet Take 1 tablet (1 mg total) by mouth 3 (three) times daily. 90 tablet 3   Cyanocobalamin (VITAMIN B-12 IJ) Inject as directed every 30 (thirty) days.     escitalopram (LEXAPRO) 20 MG tablet Take 1 tablet (20 mg total) by mouth daily. 30 tablet 2   oxyCODONE-acetaminophen (PERCOCET) 7.5-325 MG per tablet Take 1 tablet by mouth 3 (three) times daily as needed.  0   zolpidem (AMBIEN) 10 MG tablet Take 1 tablet (10 mg total) by mouth at bedtime as needed. for sleep 30 tablet 3   No current facility-administered medications for this visit.     Musculoskeletal: Strength & Muscle Tone: na Gait & Station: na Patient leans: N/A  Psychiatric Specialty Exam: Review of Systems  Musculoskeletal:  Positive for back pain.  All other systems reviewed and are negative.  There were no vitals taken for this visit.There is no height or weight on file to calculate BMI.  General Appearance: NA  Eye Contact:  NA  Speech:  Clear and Coherent  Volume:  Normal  Mood:  Dysphoric  Affect:  NA  Thought Process:  Goal Directed  Orientation:  Full (Time, Place, and Person)  Thought Content: Rumination   Suicidal Thoughts:  No  Homicidal Thoughts:  No  Memory:  Immediate;   Good Recent;   Good Remote;   Good  Judgement:  Good  Insight:  Fair  Psychomotor Activity:  Decreased  Concentration:  Concentration: Good and Attention Span: Good  Recall:  Good  Fund of Knowledge: Good  Language: Good  Akathisia:  No  Handed:  Right  AIMS (if indicated): not done  Assets:  Communication Skills Desire for Improvement Resilience Social Support Talents/Skills  ADL's:  Intact  Cognition: WNL  Sleep:  Fair   Screenings: PHQ2-9    Flowsheet Row Video Visit  [facility]2021 [facility] Video Visit from 12/11/2020 in [facility] Video Visit from 09/10/2020 in [facility]  PHQ-2 Total Score 1 0 0      Flowsheet Row Video Visit from 03/12/2021 in [facility] Video Visit from 12/11/2020 in [facility] Video Visit from 09/10/2020 in [facility]  C-SSRS RISK CATEGORY No Risk No Risk No Risk        Assessment and Plan: This patient is a 69 year old female with a history of depression anxiety.  She is tolerating the  Lexapro 20 mg daily.  She thinks it is a bit difficult to tell if it is helping due to the recent deaths in the family.  But she would like to stay on it.  She will continue Lexapro 20 mg daily for depression, Xanax 1 mg 3 times daily for anxiety and Ambien 10 mg at bedtime as needed for sleep.  She will return to see me in 3 months.   Diannia Ruder, MD 03/12/2021, 1:42 PM

## 2021-03-23 ENCOUNTER — Ambulatory Visit
Admission: RE | Admit: 2021-03-23 | Discharge: 2021-03-23 | Disposition: A | Discharge status: Home / Self Care | Payer: Medicare Other | Source: Ambulatory Visit | Attending: Internal Medicine | Admitting: Internal Medicine

## 2021-03-23 DIAGNOSIS — Z139 Encounter for screening, unspecified: Secondary | ICD-10-CM

## 2021-03-26 ENCOUNTER — Other Ambulatory Visit: Payer: Self-pay | Admitting: Internal Medicine

## 2021-03-26 DIAGNOSIS — R928 Other abnormal and inconclusive findings on diagnostic imaging of breast: Secondary | ICD-10-CM

## 2021-04-20 ENCOUNTER — Other Ambulatory Visit (HOSPITAL_COMMUNITY): Payer: Self-pay | Admitting: Psychiatry

## 2021-06-10 ENCOUNTER — Telehealth (INDEPENDENT_AMBULATORY_CARE_PROVIDER_SITE_OTHER): Payer: Medicare Other | Admitting: Psychiatry

## 2021-06-10 ENCOUNTER — Encounter (HOSPITAL_COMMUNITY): Payer: Self-pay | Admitting: Psychiatry

## 2021-06-10 ENCOUNTER — Other Ambulatory Visit: Payer: Self-pay

## 2021-06-10 DIAGNOSIS — F331 Major depressive disorder, recurrent, moderate: Secondary | ICD-10-CM

## 2021-06-10 MED ORDER — ZOLPIDEM TARTRATE 10 MG PO TABS: 10.0000 mg | ORAL_TABLET | Freq: Every evening | ORAL | 3 refills | Status: DC | PRN

## 2021-06-10 MED ORDER — ALPRAZOLAM 1 MG PO TABS: 1.0000 mg | ORAL_TABLET | Freq: Three times a day (TID) | ORAL | 3 refills | Status: DC

## 2021-06-10 MED ORDER — ESCITALOPRAM OXALATE 20 MG PO TABS: 20.0000 mg | ORAL_TABLET | Freq: Every day | ORAL | 3 refills | Status: DC

## 2021-06-10 NOTE — Progress Notes (Signed)
Virtual Visit via Telephone Note ? ?I connected with Leah Spencer on 06/10/21 at 11:40 AM EST by telephone and verified that I am speaking with the correct person using two identifiers. ? ?Location: ?Patient: home ?Provider: office ?  ?I discussed the limitations, risks, security and privacy concerns of performing an evaluation and management service by telephone and the availability of in person appointments. I also discussed with the patient that there may be a patient responsible charge related to this service. The patient expressed understanding and agreed to proceed. ? ? ? ?  ?I discussed the assessment and treatment plan with the patient. The patient was provided an opportunity to ask questions and all were answered. The patient agreed with the plan and demonstrated an understanding of the instructions. ?  ?The patient was advised to call back or seek an in-person evaluation if the symptoms worsen or if the condition fails to improve as anticipated. ? ?I provided 15 minutes of non-face-to-face time during this encounter. ? ? ?Diannia Ruder, MD ? ?[facility] MD/PA/NP OP Progress Note ? ?06/10/2021 11:49 AM ?Leah Spencer  ?MRN:  [id number] ? ?Chief Complaint:  ?Chief Complaint  ?Patient presents with  ? Depression  ? Anxiety  ? Follow-up  ? ?HPI: this patient is a 71 year old divorced white female who lives alone in [place]. She has one daughter and 2 grandchildren. She worked in [employer] most of her life but when out in 2004 on disability due to bulging disc in her back ?  ?The patient returns for follow-up after 3 months regarding her depression and anxiety. ? ?States in general she is doing well.  Last time she was very worried about the loss of her great niece to a drug overdose.  She has had a lot of losses in her family over the last several years that has been difficult.  However she is trying to stay positive.  She is continuing to deal with chronic back pain.  The oxycodone helps and she does not take  referral to pain management or neurosurgery would be of any help to her at this point.  She denies significant depression thoughts of self-harm or suicidal ideation.  Her sleep is variable depending on pain.  She feels that her anxiety is under good control. ?Visit Diagnosis:  ?  ICD-10-CM   ?1. Major depressive disorder, recurrent episode, moderate (HCC)  F33.1   ?  ? ? ?Past Psychiatric History: Long-term outpatient treatment for depression ? ?Past Medical History:  ?Past Medical History:  ?Diagnosis Date  ? Back pain   ? Fibromyalgia   ? Headache   ? Lyme disease   ?  ?Past Surgical History:  ?Procedure Laterality Date  ? ABDOMINAL HYSTERECTOMY    ? APPENDECTOMY    ? BREAST BIOPSY Right   ? CHOLECYSTECTOMY    ? right elbow surgery    ? ? ?Family Psychiatric History: See below ? ?Family History:  ?Family History  ?Problem Relation Age of Onset  ? Breast cancer Mother   ? Depression Mother   ? Alcohol abuse Father   ? Depression Sister   ? Depression Sister   ? Depression Sister   ? Depression Maternal Aunt   ? ? ?Social History:  ?Social History  ? ?Socioeconomic History  ? Marital status: Divorced  ?  Spouse name: Not on file  ? Number of children: Not on file  ? Years of education: Not on file  ? Highest education level: Not on file  ?  Occupational History  ? Not on file  ?Tobacco Use  ? Smoking status: Former  ? Smokeless tobacco: Never  ? Tobacco comments:  ?  Quit in 1983  ?Substance and Sexual Activity  ? Alcohol use: No  ? Drug use: No  ? Sexual activity: Not Currently  ?Other Topics Concern  ? Not on file  ?Social History Narrative  ? Not on file  ? ?Social Determinants of Health  ? ?Financial Resource Strain: Not on file  ?Food Insecurity: Not on file  ?Transportation Needs: Not on file  ?Physical Activity: Not on file  ?Stress: Not on file  ?Social Connections: Not on file  ? ? ?Allergies:  ?Allergies  ?Allergen Reactions  ? Codeine   ?  Itchy and jittery  ? Elemental Sulfur   ? ? ?Metabolic Disorder  Labs: ?No results found for: HGBA1C, MPG ?No results found for: PROLACTIN ?No results found for: CHOL, TRIG, HDL, CHOLHDL, VLDL, LDLCALC ?No results found for: TSH ? ?Therapeutic Level Labs: ?No results found for: LITHIUM ?No results found for: VALPROATE ?No components found for:  CBMZ ? ?Current Medications: ?Current Outpatient Medications  ?Medication Sig Dispense Refill  ? ALPRAZolam (XANAX) 1 MG tablet Take 1 tablet (1 mg total) by mouth 3 (three) times daily. 90 tablet 3  ? Cyanocobalamin (VITAMIN B-12 IJ) Inject as directed every 30 (thirty) days.    ? escitalopram (LEXAPRO) 20 MG tablet Take 1 tablet (20 mg total) by mouth daily. 30 tablet 3  ? oxyCODONE-acetaminophen (PERCOCET) 7.5-325 MG per tablet Take 1 tablet by mouth 3 (three) times daily as needed.  0  ? zolpidem (AMBIEN) 10 MG tablet Take 1 tablet (10 mg total) by mouth at bedtime as needed. for sleep 30 tablet 3  ? ?No current facility-administered medications for this visit.  ? ? ? ?Musculoskeletal: ?Strength & Muscle Tone: na ?Gait & Station: na ?Patient leans: N/A ? ?Psychiatric Specialty Exam: ?Review of Systems  ?Musculoskeletal:  Positive for arthralgias and back pain.  ?All other systems reviewed and are negative.  ?There were no vitals taken for this visit.There is no height or weight on file to calculate BMI.  ?General Appearance: NA  ?Eye Contact:  NA  ?Speech:  Clear and Coherent  ?Volume:  Normal  ?Mood:  Euthymic  ?Affect:  NA  ?Thought Process:  Goal Directed  ?Orientation:  Full (Time, Place, and Person)  ?Thought Content: Rumination   ?Suicidal Thoughts:  No  ?Homicidal Thoughts:  No  ?Memory:  Immediate;   Good ?Recent;   Good ?Remote;   Good  ?Judgement:  Good  ?Insight:  Fair  ?Psychomotor Activity:  Decreased  ?Concentration:  Concentration: Good and Attention Span: Good  ?Recall:  Good  ?Fund of Knowledge: Good  ?Language: Good  ?Akathisia:  No  ?Handed:  Right  ?AIMS (if indicated): not done  ?Assets:  Communication  Skills ?Desire for Improvement ?Resilience ?Social Support ?Talents/Skills  ?ADL's:  Intact  ?Cognition: WNL  ?Sleep:  Fair  ? ?Screenings: ?PHQ2-9   ? ?Flowsheet Row Video [facility] from [facility]10[facility]2021 [facility] Video Visit from 03/12/2021 in [facility] Video Visit from 12/11/2020 in [facility] Video Visit from 09/10/2020 in [facility]  ?PHQ-2 Total Score 1 1 0 0  ? ?  ? ?Flowsheet Row Video Visit from 06/10/2021 [facility] Video Visit from 03/12/2021 in [facility] Video Visit from  12[facility]11/[facility]  ?C-SSRS RISK CATEGORY No Risk No Risk No Risk  ? ?  ? ? ? ?Assessment and Plan: This patient is a 71 year old female with a history of depression and anxiety.  Overall she feels that her regimen is helping.  She will continue Lexapro 20 mg daily for depression, Xanax 1 mg 3 times daily for anxiety and Ambien 10 mg at bedtime as needed for sleep.  She will return to see me in 4 months ? ?Collaboration of Care: Collaboration of Care: Primary Care Provider AEB chart notes will be made available to primary provider at patient's request ? ?Patient/Guardian was advised Release of Information must be obtained prior to any record release in order to collaborate their care with an outside provider. Patient/Guardian was advised if they have not already done so to contact the registration department to sign all necessary forms in order for [place] to release information regarding their care.  ? ?Consent: Patient/Guardian gives verbal consent for treatment and assignment of benefits for services provided during this visit. Patient/Guardian expressed understanding and agreed to proceed.  ? ? ?Diannia Ruder, MD ?06/10/2021, 11:49 AM ? ?

## 2021-10-12 ENCOUNTER — Telehealth (INDEPENDENT_AMBULATORY_CARE_PROVIDER_SITE_OTHER): Payer: Medicare Other | Admitting: Psychiatry

## 2021-10-12 ENCOUNTER — Encounter (HOSPITAL_COMMUNITY): Payer: Self-pay | Admitting: Psychiatry

## 2021-10-12 DIAGNOSIS — F331 Major depressive disorder, recurrent, moderate: Secondary | ICD-10-CM | POA: Diagnosis not present

## 2021-10-12 MED ORDER — ALPRAZOLAM 1 MG PO TABS: 1.0000 mg | ORAL_TABLET | Freq: Three times a day (TID) | ORAL | 3 refills | Status: DC

## 2021-10-12 MED ORDER — ZOLPIDEM TARTRATE 10 MG PO TABS: 10.0000 mg | ORAL_TABLET | Freq: Every evening | ORAL | 3 refills | Status: DC | PRN

## 2021-10-12 MED ORDER — ESCITALOPRAM OXALATE 20 MG PO TABS: 20.0000 mg | ORAL_TABLET | Freq: Every day | ORAL | 3 refills | Status: DC

## 2021-10-12 NOTE — Progress Notes (Signed)
Virtual Visit via Telephone Note  I connected with Leah Spencer on 10/12/21 at  9:40 AM EDT by telephone and verified that I am speaking with the correct person using two identifiers.  Location: Patient: home Provider: office   I discussed the limitations, risks, security and privacy concerns of performing an evaluation and management service by telephone and the availability of in person appointments. I also discussed with the patient that there may be a patient responsible charge related to this service. The patient expressed understanding and agreed to proceed.    I discussed the assessment and treatment plan with the patient. The patient was provided an opportunity to ask questions and all were answered. The patient agreed with the plan and demonstrated an understanding of the instructions.   The patient was advised to call back or seek an in-person evaluation if the symptoms worsen or if the condition fails to improve as anticipated.  I provided 12 minutes of non-face-to-face time during this encounter.   Diannia Ruder, MD  [facility] MD/PA/NP OP Progress Note  10/12/2021 9:53 AM Leah Spencer  MRN:  [id number]  Chief Complaint:  Chief Complaint  Patient presents with   Depression   Anxiety   Follow-up   HPI: this patient is a 71 year old divorced white female who lives alone in [place]. She has one daughter and 2 grandchildren. She worked in [employer] most of her life but when out in 2004 on disability due to bulging disc in her back  The patient returns for follow-up after 4 months regarding her depression and anxiety.  For the most part she has been doing okay.  She still is very sad about the loss of her great niece last year.  She is still struggling with back pain and recently got an injection from pain management which only helped a little bit.  Most the time she sleeps okay although sometimes she wakes up very early and cannot get back to sleep.  She denies significant  depression thoughts of self-harm or suicidal ideation.  The Xanax continues to help her anxiety Visit Diagnosis:    ICD-10-CM   1. Major depressive disorder, recurrent episode, moderate (HCC)  F33.1       Past Psychiatric History: Long-term outpatient treatment for depression  Past Medical History:  Past Medical History:  Diagnosis Date   Back pain    Fibromyalgia    Headache    Lyme disease     Past Surgical History:  Procedure Laterality Date   ABDOMINAL HYSTERECTOMY     APPENDECTOMY     BREAST BIOPSY Right    CHOLECYSTECTOMY     right elbow surgery      Family Psychiatric History: See below  Family History:  Family History  Problem Relation Age of Onset   Breast cancer Mother    Depression Mother    Alcohol abuse Father    Depression Sister    Depression Sister    Depression Sister    Depression Maternal Aunt     Social History:  Social History   Socioeconomic History   Marital status: Divorced    Spouse name: Not on file   Number of children: Not on file   Years of education: Not on file   Highest education level: Not on file  Occupational History   Not on file  Tobacco Use   Smoking status: Former   Smokeless tobacco: Never   Tobacco comments:    Quit in 1983  Substance and Sexual  Activity   Alcohol use: No   Drug use: No   Sexual activity: Not Currently  Other Topics Concern   Not on file  Social History Narrative   Not on file   Social Determinants of Health   Financial Resource Strain: Not on file  Food Insecurity: Not on file  Transportation Needs: Not on file  Physical Activity: Not on file  Stress: Not on file  Social Connections: Not on file    Allergies:  Allergies  Allergen Reactions   Codeine     Itchy and jittery   Elemental Sulfur     Metabolic Disorder Labs: No results found for: "HGBA1C", "MPG" No results found for: "PROLACTIN" No results found for: "CHOL", "TRIG", "HDL", "CHOLHDL", "VLDL", "LDLCALC" No results  found for: "TSH"  Therapeutic Level Labs: No results found for: "LITHIUM" No results found for: "VALPROATE" No results found for: "CBMZ"  Current Medications: Current Outpatient Medications  Medication Sig Dispense Refill   ALPRAZolam (XANAX) 1 MG tablet Take 1 tablet (1 mg total) by mouth 3 (three) times daily. 90 tablet 3   Cyanocobalamin (VITAMIN B-12 IJ) Inject as directed every 30 (thirty) days.     escitalopram (LEXAPRO) 20 MG tablet Take 1 tablet (20 mg total) by mouth daily. 30 tablet 3   oxyCODONE-acetaminophen (PERCOCET) 7.5-325 MG per tablet Take 1 tablet by mouth 3 (three) times daily as needed.  0   zolpidem (AMBIEN) 10 MG tablet Take 1 tablet (10 mg total) by mouth at bedtime as needed. for sleep 30 tablet 3   No current facility-administered medications for this visit.     Musculoskeletal: Strength & Muscle Tone: na Gait & Station: na Patient leans: N/A  Psychiatric Specialty Exam: Review of Systems  Musculoskeletal:  Positive for back pain.  All other systems reviewed and are negative.   There were no vitals taken for this visit.There is no height or weight on file to calculate BMI.  General Appearance: NA  Eye Contact:  NA  Speech:  Clear and Coherent  Volume:  Normal  Mood:  Euthymic  Affect:  NA  Thought Process:  Goal Directed  Orientation:  Full (Time, Place, and Person)  Thought Content: WDL   Suicidal Thoughts:  No  Homicidal Thoughts:  No  Memory:  Immediate;   Good Recent;   Good Remote;   Fair  Judgement:  Good  Insight:  Fair  Psychomotor Activity:  Decreased  Concentration:  Concentration: Good and Attention Span: Good  Recall:  Good  Fund of Knowledge: Good  Language: Good  Akathisia:  No  Handed:  Right  AIMS (if indicated): not done  Assets:  Communication Skills Desire for Improvement Resilience Social Support Talents/Skills  ADL's:  Intact  Cognition: WNL  Sleep:  Fair   Screenings: PHQ2-9    Flowsheet Row Video Visit  from 10/12/2021 in [place] Video Visit from 06/10/2021 in [place] Video Visit from 03/12/2021 in [place] Video Visit from 12/11/2020 in [place] Video Visit from 09/10/2020 in [facility]  PHQ-2 Total Score 0 1 1 0 0      Flowsheet Row Video Visit from 10/12/2021 in [place] Video Visit from 06/10/2021 in [place] [facility] Video Visit from 03/12/2021 in [place] No Risk No Risk No Risk        Assessment  and Plan: This patient is a 71 year old female with a history of depression and anxiety.  For the most part she is doing well.  She will continue Lexapro 20 mg daily for depression, Xanax 1 mg 3 times daily for anxiety and Ambien 10 mg at bedtime as needed for sleep.  She will return to see me in 4 months  Collaboration of Care: Collaboration of Care: Primary Care Provider AEB notes will be made available to PCP at patient's request  Patient/Guardian was advised Release of Information must be obtained prior to any record release in order to collaborate their care with an outside provider. Patient/Guardian was advised if they have not already done so to contact the registration department to sign all necessary forms in order for [place] to release information regarding their care.   Consent: Patient/Guardian gives verbal consent for treatment and assignment of benefits for services provided during this visit. Patient/Guardian expressed understanding and agreed to proceed.    Diannia Ruder, MD 10/12/2021, 9:53 AM

## 2022-01-13 LAB — EXTERNAL GENERIC LAB PROCEDURE: COLOGUARD: NEGATIVE

## 2022-01-13 LAB — COLOGUARD: COLOGUARD: NEGATIVE

## 2022-01-27 ENCOUNTER — Telehealth (HOSPITAL_COMMUNITY): Payer: Medicare Other | Admitting: Psychiatry

## 2022-01-28 ENCOUNTER — Telehealth (INDEPENDENT_AMBULATORY_CARE_PROVIDER_SITE_OTHER): Payer: Medicare Other | Admitting: Psychiatry

## 2022-01-28 ENCOUNTER — Encounter (HOSPITAL_COMMUNITY): Payer: Self-pay | Admitting: Psychiatry

## 2022-01-28 DIAGNOSIS — F331 Major depressive disorder, recurrent, moderate: Secondary | ICD-10-CM

## 2022-01-28 MED ORDER — ZOLPIDEM TARTRATE 10 MG PO TABS: 10.0000 mg | ORAL_TABLET | Freq: Every evening | ORAL | 3 refills | Status: DC | PRN

## 2022-01-28 MED ORDER — ALPRAZOLAM 1 MG PO TABS: 1.0000 mg | ORAL_TABLET | Freq: Three times a day (TID) | ORAL | 3 refills | Status: DC

## 2022-01-28 MED ORDER — ESCITALOPRAM OXALATE 20 MG PO TABS: 20.0000 mg | ORAL_TABLET | Freq: Every day | ORAL | 3 refills | Status: DC

## 2022-01-28 NOTE — Progress Notes (Signed)
Virtual Visit via Telephone Note  I connected with Leah Spencer on 01/28/22 at  2:20 PM EDT by telephone and verified that I am speaking with the correct person using two identifiers.  Location: Patient: home Provider: office   I discussed the limitations, risks, security and privacy concerns of performing an evaluation and management service by telephone and the availability of in person appointments. I also discussed with the patient that there may be a patient responsible charge related to this service. The patient expressed understanding and agreed to proceed.     I discussed the assessment and treatment plan with the patient. The patient was provided an opportunity to ask questions and all were answered. The patient agreed with the plan and demonstrated an understanding of the instructions.   The patient was advised to call back or seek an in-person evaluation if the symptoms worsen or if the condition fails to improve as anticipated.  I provided 15 minutes of non-face-to-face time during this encounter.   Leah Ruder, MD  [facility] MD/PA/NP OP Progress Note  01/28/2022 2:30 PM [name]  MRN:  [id number]  Chief Complaint:  Chief Complaint  Patient presents with   Depression   Anxiety   Follow-up   HPI: this patient is a 71 year old divorced white female who lives alone in [place]. She has one daughter and 2 grandchildren. She worked in [employer] most of her life but when out in 2004 on disability due to bulging disc in her back  The patient returns for follow-up after about 3 months regarding her depression and anxiety.  For the most part she is doing okay.  She still having a lot of back pain but does not think she would qualify for surgery.  She is dealing with that with Percocet which seems to work fairly well most of the time.  She denies significant depression anxiety thoughts of self-harm or suicide.  Her sleep is a little bit variable due to pain. Visit Diagnosis:     ICD-10-CM   1. Major depressive disorder, recurrent episode, moderate (HCC)  F33.1       Past Psychiatric History: Long-term outpatient treatment for depression  Past Medical History:  Past Medical History:  Diagnosis Date   Back pain    Fibromyalgia    Headache    Lyme disease     Past Surgical History:  Procedure Laterality Date   ABDOMINAL HYSTERECTOMY     APPENDECTOMY     BREAST BIOPSY Right    CHOLECYSTECTOMY     right elbow surgery      Family Psychiatric History: See below  Family History:  Family History  Problem Relation Age of Onset   Breast cancer Mother    Depression Mother    Alcohol abuse Father    Depression Sister    Depression Sister    Depression Sister    Depression Maternal Aunt     Social History:  Social History   Socioeconomic History   Marital status: Divorced    Spouse name: Not on file   Number of children: Not on file   Years of education: Not on file   Highest education level: Not on file  Occupational History   Not on file  Tobacco Use   Smoking status: Former   Smokeless tobacco: Never   Tobacco comments:    Quit in 1983  Substance and Sexual Activity   Alcohol use: No   Drug use: No   Sexual activity: Not Currently  Other Topics Concern   Not on file  Social History Narrative   Not on file   Social Determinants of Health   Financial Resource Strain: Not on file  Food Insecurity: Not on file  Transportation Needs: Not on file  Physical Activity: Not on file  Stress: Not on file  Social Connections: Not on file    Allergies:  Allergies  Allergen Reactions   Codeine     Itchy and jittery   Elemental Sulfur     Metabolic Disorder Labs: No results found for: "HGBA1C", "MPG" No results found for: "PROLACTIN" No results found for: "CHOL", "TRIG", "HDL", "CHOLHDL", "VLDL", "LDLCALC" No results found for: "TSH"  Therapeutic Level Labs: No results found for: "LITHIUM" No results found for:  "VALPROATE" No results found for: "CBMZ"  Current Medications: Current Outpatient Medications  Medication Sig Dispense Refill   ALPRAZolam (XANAX) 1 MG tablet Take 1 tablet (1 mg total) by mouth 3 (three) times daily. 90 tablet 3   Cyanocobalamin (VITAMIN B-12 IJ) Inject as directed every 30 (thirty) days.     escitalopram (LEXAPRO) 20 MG tablet Take 1 tablet (20 mg total) by mouth daily. 30 tablet 3   oxyCODONE-acetaminophen (PERCOCET) 7.5-325 MG per tablet Take 1 tablet by mouth 3 (three) times daily as needed.  0   zolpidem (AMBIEN) 10 MG tablet Take 1 tablet (10 mg total) by mouth at bedtime as needed. for sleep 30 tablet 3   No current facility-administered medications for this visit.     Musculoskeletal: Strength & Muscle Tone: na Gait & Station: na Patient leans: N/A  Psychiatric Specialty Exam: Review of Systems  Musculoskeletal:  Positive for back pain.  All other systems reviewed and are negative.   There were no vitals taken for this visit.There is no height or weight on file to calculate BMI.  General Appearance: NA  Eye Contact:  NA  Speech:  Clear and Coherent  Volume:  Normal  Mood:  Euthymic  Affect:  NA  Thought Process:  Goal Directed  Orientation:  Full (Time, Place, and Person)  Thought Content: WDL   Suicidal Thoughts:  No  Homicidal Thoughts:  No  Memory:  Immediate;   Good Recent;   Good Remote;   Fair  Judgement:  Good  Insight:  Good  Psychomotor Activity:  Normal  Concentration:  Concentration: Good and Attention Span: Good  Recall:  Good  Fund of Knowledge: Good  Language: Good  Akathisia:  No  Handed:  Right  AIMS (if indicated): not done  Assets:  Communication Skills Desire for Improvement Resilience Social Support Talents/Skills  ADL's:  Intact  Cognition: WNL  Sleep:  Good   Screenings: PHQ2-9    Flowsheet Row Video Visit from 10/12/2021 in [place] Video Visit from 06/10/2021 in  [place] [facility] Video Visit from 03/12/2021 in [place] [facility] Video Visit from 12/11/2020 in [place] Video Visit from 09/10/2020 in [place] [facility]  PHQ-2 Total Score 0 1 1 0 0      Flowsheet Row Video Visit from 10/12/2021 in [place] Video Visit from 06/10/2021 in [place] [facility] Video Visit from 03/12/2021 in [place] No Risk No Risk No Risk        Assessment and Plan: This patient is a 71 year old female with a history of depression anxiety generally she is doing  well.  She will continue Lexapro 20 mg daily for depression, Xanax 1 mg 3 times daily for anxiety and Ambien 10 mg at bedtime as needed for sleep.  She will return to see me in 4 months  Collaboration of Care: Collaboration of Care: Primary Care Provider AEB notes will be shared with PCP at patient's request  Patient/Guardian was advised Release of Information must be obtained prior to any record release in order to collaborate their care with an outside provider. Patient/Guardian was advised if they have not already done so to contact the registration department to sign all necessary forms in order for [place] to release information regarding their care.   Consent: Patient/Guardian gives verbal consent for treatment and assignment of benefits for services provided during this visit. Patient/Guardian expressed understanding and agreed to proceed.    Leah Ruder, MD 01/28/2022, 2:30 PM

## 2022-03-17 ENCOUNTER — Other Ambulatory Visit (HOSPITAL_COMMUNITY): Payer: Self-pay | Admitting: Psychiatry

## 2022-05-25 ENCOUNTER — Encounter (HOSPITAL_COMMUNITY): Payer: Self-pay | Admitting: Psychiatry

## 2022-05-25 ENCOUNTER — Telehealth (INDEPENDENT_AMBULATORY_CARE_PROVIDER_SITE_OTHER): Payer: Medicare Other | Admitting: Psychiatry

## 2022-05-25 DIAGNOSIS — F331 Major depressive disorder, recurrent, moderate: Secondary | ICD-10-CM | POA: Diagnosis not present

## 2022-05-25 MED ORDER — ZOLPIDEM TARTRATE 10 MG PO TABS: 10.0000 mg | ORAL_TABLET | Freq: Every evening | ORAL | 3 refills | Status: DC | PRN

## 2022-05-25 MED ORDER — ALPRAZOLAM 1 MG PO TABS: 1.0000 mg | ORAL_TABLET | Freq: Three times a day (TID) | ORAL | 3 refills | Status: DC

## 2022-05-25 MED ORDER — ESCITALOPRAM OXALATE 20 MG PO TABS: 20.0000 mg | ORAL_TABLET | Freq: Every day | ORAL | 3 refills | Status: DC

## 2022-05-25 NOTE — Progress Notes (Signed)
Virtual Visit via Telephone Note  I connected with Leah Spencer on 05/25/22 at  1:20 PM EST by telephone and verified that I am speaking with the correct person using two identifiers.  Location: Patient: home Provider: office   I discussed the limitations, risks, security and privacy concerns of performing an evaluation and management service by telephone and the availability of in person appointments. I also discussed with the patient that there may be a patient responsible charge related to this service. The patient expressed understanding and agreed to proceed.     I discussed the assessment and treatment plan with the patient. The patient was provided an opportunity to ask questions and all were answered. The patient agreed with the plan and demonstrated an understanding of the instructions.   The patient was advised to call back or seek an in-person evaluation if the symptoms worsen or if the condition fails to improve as anticipated.  I provided 20 minutes of non-face-to-face time during this encounter.   Levonne Spiller, MD  [facility] MD/PA/NP OP Progress Note  05/25/2022 1:35 PM Leah Spencer  MRN:  [id number]  Chief Complaint:  Chief Complaint  Patient presents with   Depression   Anxiety   Follow-up   HPI:  this patient is a 72 year old divorced white female who lives alone in [place]. She has one daughter and 2 grandchildren. She worked in [employer] most of her life but when out in 2004 on disability due to bulging disc in her back   The patient returns for follow-up after 4 months regarding her depression and anxiety.  Most of the time she is doing okay but she states her lower back pain is getting worse.  She states that her primary doctor Dr. Woody Seller has really not done much about it but he is willing to give her a referral to orthopedics.  Her daughter is looking into getting the best possible referral for her.  Often the pain in her back wakes her up at night but she does  not want to change pain medication right yet.  The Percocet does help but only to a certain degree.  She has not had an MRI of her back for 20 years.  She denies significant depression anxiety or thoughts of self-harm or suicide. Visit Diagnosis:    ICD-10-CM   1. Major depressive disorder, recurrent episode, moderate (HCC)  F33.1       Past Psychiatric History: Long-term outpatient treatment for depression  Past Medical History:  Past Medical History:  Diagnosis Date   Back pain    Fibromyalgia    Headache    Lyme disease     Past Surgical History:  Procedure Laterality Date   ABDOMINAL HYSTERECTOMY     APPENDECTOMY     BREAST BIOPSY Right    CHOLECYSTECTOMY     right elbow surgery      Family Psychiatric History: see below  Family History:  Family History  Problem Relation Age of Onset   Breast cancer Mother    Depression Mother    Alcohol abuse Father    Depression Sister    Depression Sister    Depression Sister    Depression Maternal Aunt     Social History:  Social History   Socioeconomic History   Marital status: Divorced    Spouse name: Not on file   Number of children: Not on file   Years of education: Not on file   Highest education level: Not on file  Occupational History   Not on file  Tobacco Use   Smoking status: Former   Smokeless tobacco: Never   Tobacco comments:    Quit in 45  Substance and Sexual Activity   Alcohol use: No   Drug use: No   Sexual activity: Not Currently  Other Topics Concern   Not on file  Social History Narrative   Not on file   Social Determinants of Health   Financial Resource Strain: Not on file  Food Insecurity: Not on file  Transportation Needs: Not on file  Physical Activity: Not on file  Stress: Not on file  Social Connections: Not on file    Allergies:  Allergies  Allergen Reactions   Codeine     Itchy and jittery   Elemental Sulfur     Metabolic Disorder Labs: No results found for:  "HGBA1C", "MPG" No results found for: "PROLACTIN" No results found for: "CHOL", "TRIG", "HDL", "CHOLHDL", "VLDL", "LDLCALC" No results found for: "TSH"  Therapeutic Level Labs: No results found for: "LITHIUM" No results found for: "VALPROATE" No results found for: "CBMZ"  Current Medications: Current Outpatient Medications  Medication Sig Dispense Refill   ALPRAZolam (XANAX) 1 MG tablet Take 1 tablet (1 mg total) by mouth 3 (three) times daily. 90 tablet 3   Cyanocobalamin (VITAMIN B-12 IJ) Inject as directed every 30 (thirty) days.     escitalopram (LEXAPRO) 20 MG tablet Take 1 tablet (20 mg total) by mouth daily. 30 tablet 3   oxyCODONE-acetaminophen (PERCOCET) 7.5-325 MG per tablet Take 1 tablet by mouth 3 (three) times daily as needed.  0   zolpidem (AMBIEN) 10 MG tablet Take 1 tablet (10 mg total) by mouth at bedtime as needed. for sleep 30 tablet 3   No current facility-administered medications for this visit.     Musculoskeletal: Strength & Muscle Tone: na Gait & Station: na Patient leans: N/A  Psychiatric Specialty Exam: Review of Systems  Musculoskeletal:  Positive for back pain and myalgias.  Psychiatric/Behavioral:  Positive for sleep disturbance.   All other systems reviewed and are negative.   There were no vitals taken for this visit.There is no height or weight on file to calculate BMI.  General Appearance: NA  Eye Contact:  NA  Speech:  Clear and Coherent  Volume:  Normal  Mood:  Euthymic  Affect:  NA  Thought Process:  Goal Directed  Orientation:  Full (Time, Place, and Person)  Thought Content: WDL   Suicidal Thoughts:  No  Homicidal Thoughts:  No  Memory:  Immediate;   Good Recent;   Good Remote;   Good  Judgement:  Good  Insight:  Good  Psychomotor Activity:  Decreased  Concentration:  Concentration: Good and Attention Span: Good  Recall:  Good  Fund of Knowledge: Good  Language: Good  Akathisia:  No  Handed:  Right  AIMS (if indicated):  not done  Assets:  Communication Skills Desire for Improvement Resilience Social Support  ADL's:  Intact  Cognition: WNL  Sleep:  Fair   Screenings: PHQ2-9    Flowsheet Row Video Visit from 10/12/2021 in [place] [facility] [place] Video Visit from 06/10/2021 in [place] [facility] [place] Video Visit from 03/12/2021 in [place] [facility] [place] Video Visit from 12/11/2020 in [place] [facility] [place] Video Visit from 09/10/2020 in [place] [facility] Total Score 0 1 1 0 0  Flowsheet Row Video Visit from 10/12/2021 in [place] [facility] [place] Video Visit from 06/10/2021 in [place] [facility] [place] Video Visit from 03/12/2021 in [place] [facility] [place] No Risk No Risk No Risk        Assessment and Plan: This patient is a 72 year old female with a history of depression anxiety.  She is dealing with back pain but overall mentally she is doing well.  She will continue Lexapro 20 mg daily for depression, Xanax 1 mg 3 times daily for anxiety and Ambien 10 mg at bedtime as needed for sleep.  She will return to see me in 4 months  Collaboration of Care: Collaboration of Care: Notes will be shared with PCP at patient's request  Patient/Guardian was advised Release of Information must be obtained prior to any record release in order to collaborate their care with an outside provider. Patient/Guardian was advised if they have not already done so to contact the registration department to sign all necessary forms in order for [place] to release information regarding their care.   Consent: Patient/Guardian gives verbal consent for treatment and assignment of benefits for services provided during this visit. Patient/Guardian expressed understanding and  agreed to proceed.    Levonne Spiller, MD 05/25/2022, 1:35 PM

## 2022-08-31 ENCOUNTER — Other Ambulatory Visit (HOSPITAL_COMMUNITY): Payer: Self-pay | Admitting: Psychiatry

## 2022-08-31 MED ORDER — ESCITALOPRAM OXALATE 20 MG PO TABS: 20.0000 mg | ORAL_TABLET | Freq: Every day | ORAL | 3 refills | Status: DC

## 2022-08-31 MED ORDER — ZOLPIDEM TARTRATE 10 MG PO TABS: 10.0000 mg | ORAL_TABLET | Freq: Every evening | ORAL | 3 refills | Status: DC | PRN

## 2022-08-31 MED ORDER — ALPRAZOLAM 1 MG PO TABS: 1.0000 mg | ORAL_TABLET | Freq: Three times a day (TID) | ORAL | 3 refills | Status: DC

## 2022-09-23 ENCOUNTER — Telehealth (INDEPENDENT_AMBULATORY_CARE_PROVIDER_SITE_OTHER): Payer: Medicare Other | Admitting: Psychiatry

## 2022-09-23 ENCOUNTER — Encounter (HOSPITAL_COMMUNITY): Payer: Self-pay | Admitting: Psychiatry

## 2022-09-23 DIAGNOSIS — F331 Major depressive disorder, recurrent, moderate: Secondary | ICD-10-CM | POA: Diagnosis not present

## 2022-09-23 MED ORDER — ESCITALOPRAM OXALATE 20 MG PO TABS: 20.0000 mg | ORAL_TABLET | Freq: Every day | ORAL | 3 refills | Status: DC

## 2022-09-23 MED ORDER — ALPRAZOLAM 1 MG PO TABS: 1.0000 mg | ORAL_TABLET | Freq: Three times a day (TID) | ORAL | 3 refills | Status: DC

## 2022-09-23 MED ORDER — ZOLPIDEM TARTRATE 10 MG PO TABS: 10.0000 mg | ORAL_TABLET | Freq: Every evening | ORAL | 3 refills | Status: DC | PRN

## 2022-09-23 NOTE — Progress Notes (Signed)
Virtual Visit via Telephone Note  I connected with Leah Spencer on 09/23/22 at  1:00 PM EDT by telephone and verified that I am speaking with the correct person using two identifiers.  Location: Patient: home Provider: office   I discussed the limitations, risks, security and privacy concerns of performing an evaluation and management service by telephone and the availability of in person appointments. I also discussed with the patient that there may be a patient responsible charge related to this service. The patient expressed understanding and agreed to proceed.      I discussed the assessment and treatment plan with the patient. The patient was provided an opportunity to ask questions and all were answered. The patient agreed with the plan and demonstrated an understanding of the instructions.   The patient was advised to call back or seek an in-person evaluation if the symptoms worsen or if the condition fails to improve as anticipated.  I provided 15 minutes of non-face-to-face time during this encounter.   Leah Ruder, MD  [facility] MD/PA/NP OP Progress Note  09/23/2022 1:20 PM ZMYA LYNG  MRN:  [id number]  Chief Complaint:  Chief Complaint  Patient presents with   Anxiety   Depression   Follow-up   HPI:  this patient is a 72 year old divorced white female who lives alone in [place]. She has one daughter and 2 grandchildren. She worked in [employer] most of her life but when out in 2004 on disability due to bulging disc in her back   The patient returns for follow-up after 4 months regarding her depression and anxiety.  She again complains of severe pain in her back particularly sciatic pain.  She complained about this last visit and I urged her to get seen by orthopedics.  This still has not happened but she claims she is going to try to get an appointment this summer.  She has had injections in her back in the past that were not helpful.  It is not known if she is a  candidate for surgery until she gets evaluated.  Often the pain in the back still wakes her up but she does not want to change any pain management or sleep medicines.  She denies significant depression or anxiety.  She states that she does what she can from day-to-day depending on the pain level.  She denies thoughts of self-harm or suicide.  She has been getting out a little bit more with her daughter and grandchildren Visit Diagnosis:    ICD-10-CM   1. Major depressive disorder, recurrent episode, moderate (HCC)  F33.1       Past Psychiatric History: Long-term outpatient treatment for depression  Past Medical History:  Past Medical History:  Diagnosis Date   Back pain    Fibromyalgia    Headache    Lyme disease     Past Surgical History:  Procedure Laterality Date   ABDOMINAL HYSTERECTOMY     APPENDECTOMY     BREAST BIOPSY Right    CHOLECYSTECTOMY     right elbow surgery      Family Psychiatric History: See below  Family History:  Family History  Problem Relation Age of Onset   Breast cancer Mother    Depression Mother    Alcohol abuse Father    Depression Sister    Depression Sister    Depression Sister    Depression Maternal Aunt     Social History:  Social History   Socioeconomic History   Marital status:  Divorced    Spouse name: Not on file   Number of children: Not on file   Years of education: Not on file   Highest education level: Not on file  Occupational History   Not on file  Tobacco Use   Smoking status: Former   Smokeless tobacco: Never   Tobacco comments:    Quit in 44  Substance and Sexual Activity   Alcohol use: No   Drug use: No   Sexual activity: Not Currently  Other Topics Concern   Not on file  Social History Narrative   Not on file   Social Determinants of Health   Financial Resource Strain: Not on file  Food Insecurity: Not on file  Transportation Needs: Not on file  Physical Activity: Not on file  Stress: Not on file   Social Connections: Not on file    Allergies:  Allergies  Allergen Reactions   Codeine     Itchy and jittery   Elemental Sulfur     Metabolic Disorder Labs: No results found for: "HGBA1C", "MPG" No results found for: "PROLACTIN" No results found for: "CHOL", "TRIG", "HDL", "CHOLHDL", "VLDL", "LDLCALC" No results found for: "TSH"  Therapeutic Level Labs: No results found for: "LITHIUM" No results found for: "VALPROATE" No results found for: "CBMZ"  Current Medications: Current Outpatient Medications  Medication Sig Dispense Refill   ALPRAZolam (XANAX) 1 MG tablet Take 1 tablet (1 mg total) by mouth 3 (three) times daily. 90 tablet 3   Cyanocobalamin (VITAMIN B-12 IJ) Inject as directed every 30 (thirty) days.     escitalopram (LEXAPRO) 20 MG tablet Take 1 tablet (20 mg total) by mouth daily. 30 tablet 3   oxyCODONE-acetaminophen (PERCOCET) 7.5-325 MG per tablet Take 1 tablet by mouth 3 (three) times daily as needed.  0   zolpidem (AMBIEN) 10 MG tablet Take 1 tablet (10 mg total) by mouth at bedtime as needed. for sleep 30 tablet 3   No current facility-administered medications for this visit.     Musculoskeletal: Strength & Muscle Tone: na Gait & Station: na Patient leans: N/A  Psychiatric Specialty Exam: Review of Systems  Musculoskeletal:  Positive for arthralgias, back pain and myalgias.  Psychiatric/Behavioral:  Positive for sleep disturbance.   All other systems reviewed and are negative.   There were no vitals taken for this visit.There is no height or weight on file to calculate BMI.  General Appearance: NA  Eye Contact:  NA  Speech:  Clear and Coherent  Volume:  Normal  Mood:  Euthymic  Affect:  NA  Thought Process:  Goal Directed  Orientation:  Full (Time, Place, and Person)  Thought Content: Rumination   Suicidal Thoughts:  No  Homicidal Thoughts:  No  Memory:  Immediate;   Good Recent;   Good Remote;   NA  Judgement:  Good  Insight:  Fair   Psychomotor Activity:  Decreased  Concentration:  Concentration: Good and Attention Span: Good  Recall:  Good  Fund of Knowledge: Good  Language: Good  Akathisia:  No  Handed:  Right  AIMS (if indicated): not done  Assets:  Communication Skills Desire for Improvement Resilience Social Support  ADL's:  Intact  Cognition: WNL  Sleep:  Fair   Screenings: PHQ2-9    Flowsheet Row Video Visit from 10/12/2021 in [facility] at [place] Video Visit from 06/10/2021 in [facility] at [place] Video Visit from 03/12/2021 in [facility] at [place]  Video Visit from 12/11/2020 in [facility] at [place] Video Visit from 09/10/2020 in [facility] Total Score 0 1 1 0 0      Flowsheet Row Video Visit from 10/12/2021 in [facility] at [place] Video Visit from 06/10/2021 in [facility] at [place] Video Visit from 03/12/2021 in [facility] at [place]  C-SSRS RISK CATEGORY No Risk No Risk No Risk        Assessment and Plan: This patient is a 46 feet-year-old female with a history of depression and anxiety.  She states overall she is doing well on her current regimen.  She will continue Lexapro 20 mg daily for depression, Xanax 1 mg 3 times daily for anxiety and Ambien 10 mg at bedtime as needed for sleep.  She will return to see me in 4 months  Collaboration of Care: Collaboration of Care: Primary Care Provider AEB notes will be shared with PCP at patient's request  Patient/Guardian was advised Release of Information must be obtained prior to any record release in order to collaborate their care with an outside provider. Patient/Guardian was advised if they have not already done so to contact the registration department to sign all necessary forms  in order for [place] to release information regarding their care.   Consent: Patient/Guardian gives verbal consent for treatment and assignment of benefits for services provided during this visit. Patient/Guardian expressed understanding and agreed to proceed.    Leah Ruder, MD 09/23/2022, 1:20 PM

## 2023-01-24 ENCOUNTER — Telehealth (INDEPENDENT_AMBULATORY_CARE_PROVIDER_SITE_OTHER): Payer: Medicare Other | Admitting: Psychiatry

## 2023-01-24 ENCOUNTER — Encounter (HOSPITAL_COMMUNITY): Payer: Self-pay | Admitting: Psychiatry

## 2023-01-24 DIAGNOSIS — F331 Major depressive disorder, recurrent, moderate: Secondary | ICD-10-CM

## 2023-01-24 MED ORDER — ALPRAZOLAM 1 MG PO TABS: 1.0000 mg | ORAL_TABLET | Freq: Three times a day (TID) | ORAL | 5 refills | Status: DC

## 2023-01-24 MED ORDER — ESCITALOPRAM OXALATE 20 MG PO TABS: 20.0000 mg | ORAL_TABLET | Freq: Every day | ORAL | 5 refills | Status: DC

## 2023-01-24 MED ORDER — ZOLPIDEM TARTRATE 10 MG PO TABS: 10.0000 mg | ORAL_TABLET | Freq: Every evening | ORAL | 5 refills | Status: DC | PRN

## 2023-01-24 NOTE — Progress Notes (Signed)
Virtual Visit via Telephone Note  I connected with Leah Spencer on 01/24/23 at  1:00 PM EDT by telephone and verified that I am speaking with the correct person using two identifiers.  Location: Patient: home Provider: office   I discussed the limitations, risks, security and privacy concerns of performing an evaluation and management service by telephone and the availability of in person appointments. I also discussed with the patient that there may be a patient responsible charge related to this service. The patient expressed understanding and agreed to proceed.       I discussed the assessment and treatment plan with the patient. The patient was provided an opportunity to ask questions and all were answered. The patient agreed with the plan and demonstrated an understanding of the instructions.   The patient was advised to call back or seek an in-person evaluation if the symptoms worsen or if the condition fails to improve as anticipated.  I provided 15 minutes of non-face-to-face time during this encounter.   Diannia Ruder, MD  [facility] MD/PA/NP OP Progress Note  01/24/2023 1:12 PM Leah Spencer  MRN:  [id number]  Chief Complaint:  Chief Complaint  Patient presents with   Depression   Anxiety   Follow-up   HPI:  this patient is a 72 year old divorced white female who lives alone in [place]. She has one daughter and 2 grandchildren. She worked in [employer] most of her life but when out in 2004 on disability due to bulging disc in her back   The patient returns for follow-up after 4 months regarding her depression and anxiety.  She still having significant back pain but has been going to a back specialist in [place] and is going to be getting more injections and possibly nerve ablation.  At least she is hopeful about things getting better.  Over all her depression and anxiety are well-controlled and she denies thoughts of self-harm or suicide.  She is sleeping fairly well  given the back pain. Visit Diagnosis:    ICD-10-CM   1. Major depressive disorder, recurrent episode, moderate (HCC)  F33.1       Past Psychiatric History: Long-term outpatient treatment for depression  Past Medical History:  Past Medical History:  Diagnosis Date   Back pain    Fibromyalgia    Headache    Lyme disease     Past Surgical History:  Procedure Laterality Date   ABDOMINAL HYSTERECTOMY     APPENDECTOMY     BREAST BIOPSY Right    CHOLECYSTECTOMY     right elbow surgery      Family Psychiatric History: See below  Family History:  Family History  Problem Relation Age of Onset   Breast cancer Mother    Depression Mother    Alcohol abuse Father    Depression Sister    Depression Sister    Depression Sister    Depression Maternal Aunt     Social History:  Social History   Socioeconomic History   Marital status: Divorced    Spouse name: Not on file   Number of children: Not on file   Years of education: Not on file   Highest education level: Not on file  Occupational History   Not on file  Tobacco Use   Smoking status: Former   Smokeless tobacco: Never   Tobacco comments:    Quit in 1983  Substance and Sexual Activity   Alcohol use: No   Drug use: No   Sexual activity:  Not Currently  Other Topics Concern   Not on file  Social History Narrative   Not on file   Social Determinants of Health   Financial Resource Strain: Not on file  Food Insecurity: Not on file  Transportation Needs: Not on file  Physical Activity: Not on file  Stress: Not on file  Social Connections: Not on file    Allergies:  Allergies  Allergen Reactions   Codeine     Itchy and jittery   Elemental Sulfur     Metabolic Disorder Labs: No results found for: "HGBA1C", "MPG" No results found for: "PROLACTIN" No results found for: "CHOL", "TRIG", "HDL", "CHOLHDL", "VLDL", "LDLCALC" No results found for: "TSH"  Therapeutic Level Labs: No results found for:  "LITHIUM" No results found for: "VALPROATE" No results found for: "CBMZ"  Current Medications: Current Outpatient Medications  Medication Sig Dispense Refill   ALPRAZolam (XANAX) 1 MG tablet Take 1 tablet (1 mg total) by mouth 3 (three) times daily. 90 tablet 5   Cyanocobalamin (VITAMIN B-12 IJ) Inject as directed every 30 (thirty) days.     escitalopram (LEXAPRO) 20 MG tablet Take 1 tablet (20 mg total) by mouth daily. 30 tablet 5   oxyCODONE-acetaminophen (PERCOCET) 7.5-325 MG per tablet Take 1 tablet by mouth 3 (three) times daily as needed.  0   zolpidem (AMBIEN) 10 MG tablet Take 1 tablet (10 mg total) by mouth at bedtime as needed. for sleep 30 tablet 5   No current facility-administered medications for this visit.     Musculoskeletal: Strength & Muscle Tone: na Gait & Station: na Patient leans: N/A  Psychiatric Specialty Exam: Review of Systems  Musculoskeletal:  Positive for back pain.  All other systems reviewed and are negative.   There were no vitals taken for this visit.There is no height or weight on file to calculate BMI.  General Appearance: NA  Eye Contact:  NA  Speech:  Clear and Coherent  Volume:  Normal  Mood:  Euthymic  Affect:  NA  Thought Process:  Goal Directed  Orientation:  Full (Time, Place, and Person)  Thought Content: WDL   Suicidal Thoughts:  No  Homicidal Thoughts:  No  Memory:  Immediate;   Good Recent;   Good Remote;   Good  Judgement:  Good  Insight:  Fair  Psychomotor Activity:  Decreased  Concentration:  Concentration: Good and Attention Span: Good  Recall:  Good  Fund of Knowledge: Good  Language: Good  Akathisia:  No  Handed:  Right  AIMS (if indicated): not done  Assets:  Communication Skills Desire for Improvement Resilience Social Support  ADL's:  Intact  Cognition: WNL  Sleep:  Fair   Screenings: PHQ2-9    Flowsheet Row Video Visit from 10/12/2021 in [facility] at [place] Video  Visit from 06/10/2021 in [facility] at [place] Video Visit from 03/12/2021 in [facility] at [place] Video Visit from 12/11/2020 in [facility] at [place] Video Visit from 09/10/2020 in [facility] at [facility] Total Score 0 1 1 0 0      Flowsheet Row Video Visit from 10/12/2021 in [facility] at [place] Video Visit from 06/10/2021 in [facility] at [place] Video Visit from 03/12/2021 in [facility] Out[facility] at [facility] RISK CATEGORY No Risk No Risk No Risk        Assessment  and Plan: This patient is a 72 year old female with a history of depression and anxiety.  She continues to do well on her current regimen.  She will continue Lexapro 20 mg daily for depression, Xanax 1 mg up to 3 times daily for anxiety and Ambien 10 mg at bedtime as needed for sleep.  She knows not to combine her pain medicine with psychiatric meds.  She will return to see me in 6 months.  Collaboration of Care: Collaboration of Care: Primary Care Provider AEB notes will be shared with PCP at patient's request  Patient/Guardian was advised Release of Information must be obtained prior to any record release in order to collaborate their care with an outside provider. Patient/Guardian was advised if they have not already done so to contact the registration department to sign all necessary forms in order for [place] to release information regarding their care.   Consent: Patient/Guardian gives verbal consent for treatment and assignment of benefits for services provided during this visit. Patient/Guardian expressed understanding and agreed to proceed.    Diannia Ruder, MD 01/24/2023, 1:12 PM

## 2023-03-12 IMAGING — MG MM DIGITAL SCREENING BILAT W/ TOMO AND CAD
6 of 10 series · 6 of 30 positions shown · non-contrast
Comparison: Previous exam(s).

CLINICAL DATA: Screening.

EXAM:
DIGITAL SCREENING BILATERAL MAMMOGRAM WITH TOMOSYNTHESIS AND CAD
TECHNIQUE: Bilateral screening digital craniocaudal and mediolateral oblique
mammograms were obtained. Bilateral screening digital breast
tomosynthesis was performed. The images were evaluated with
computer-aided detection.

[R CC synth-2D]
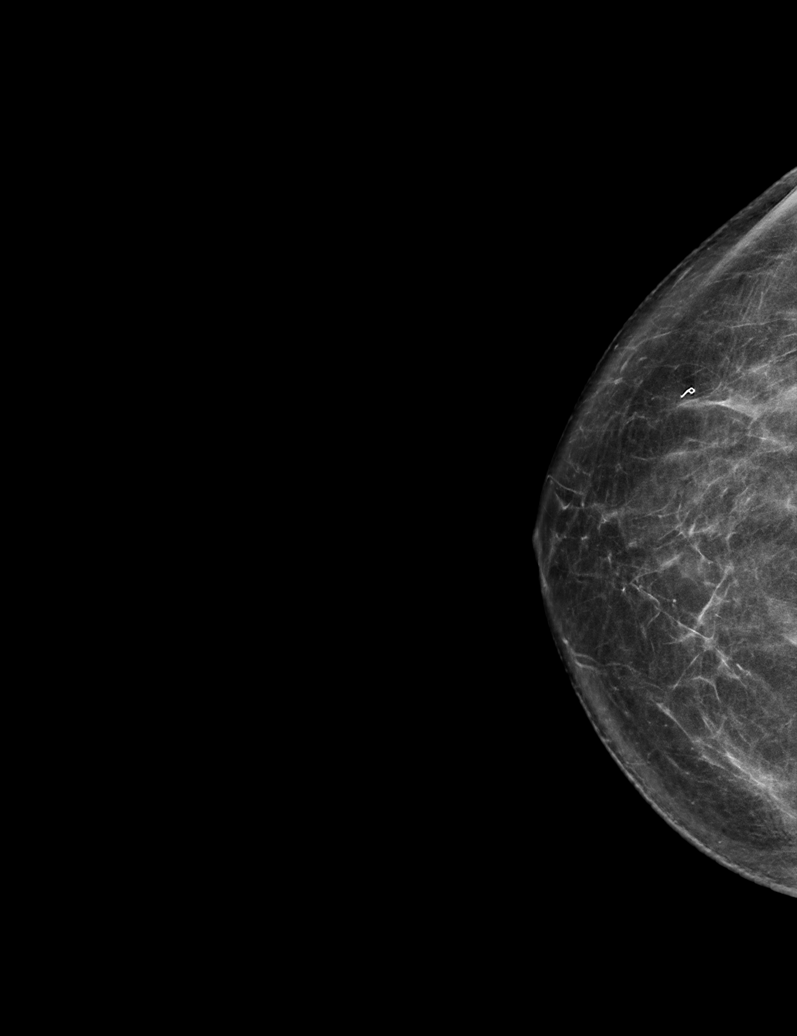

[L MLO synth-2D (1 of 2)]
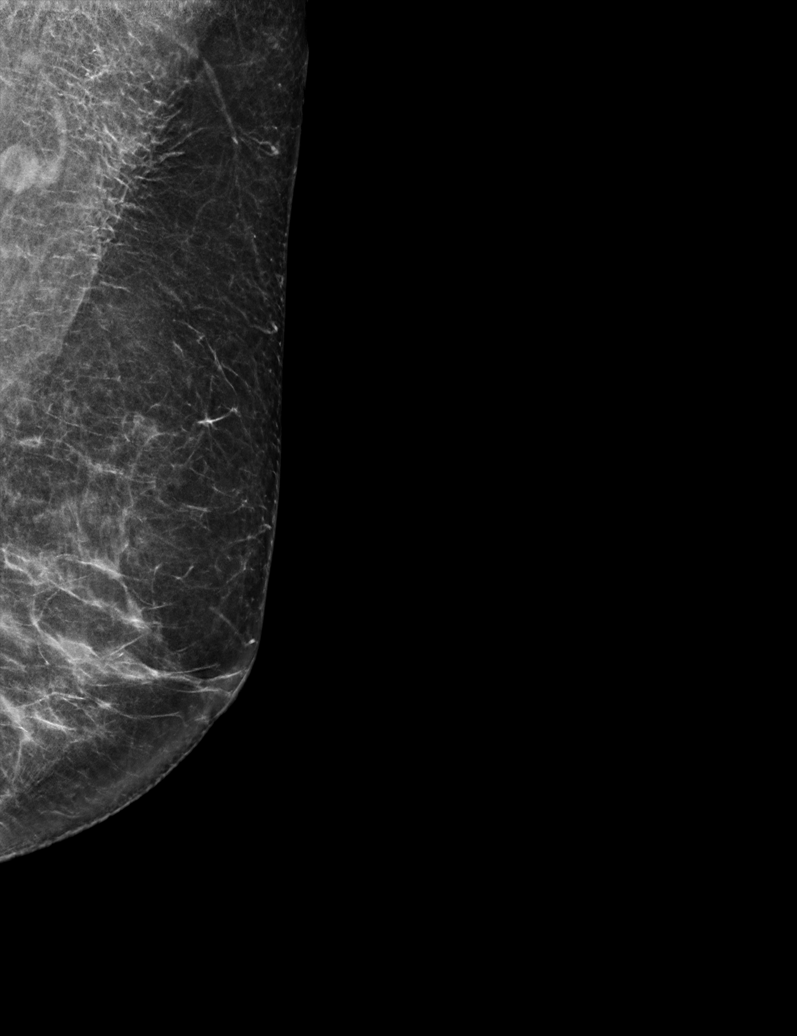

[R MLO synth-2D]
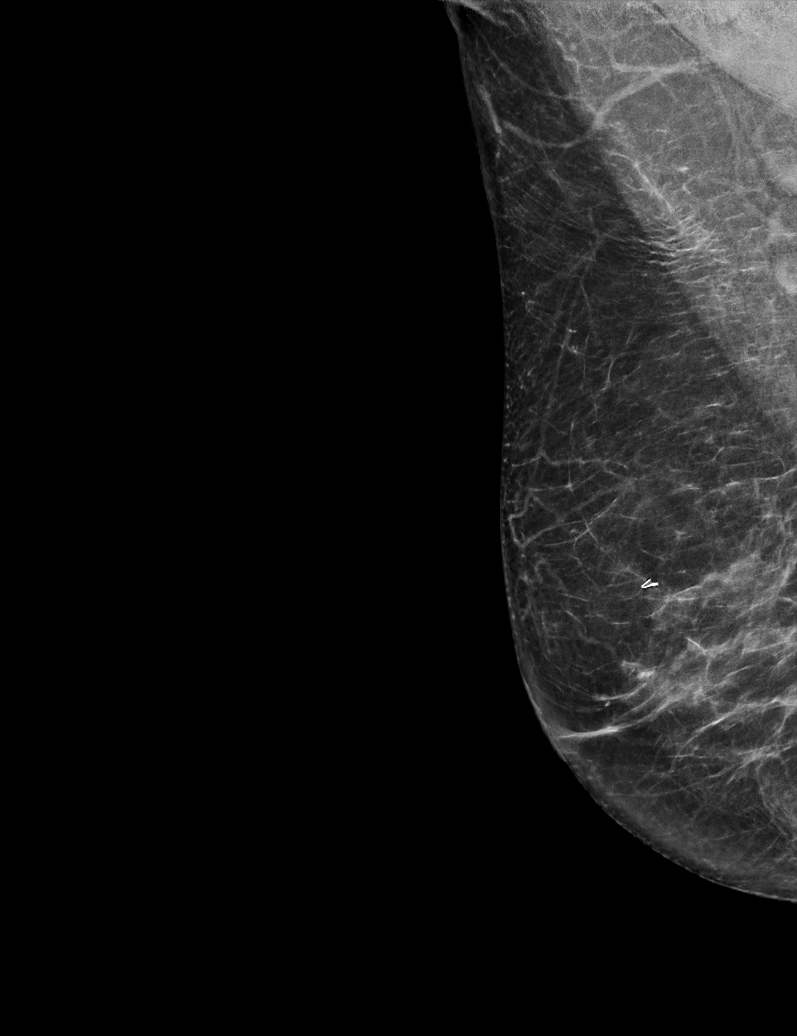

[L CC synth-2D]
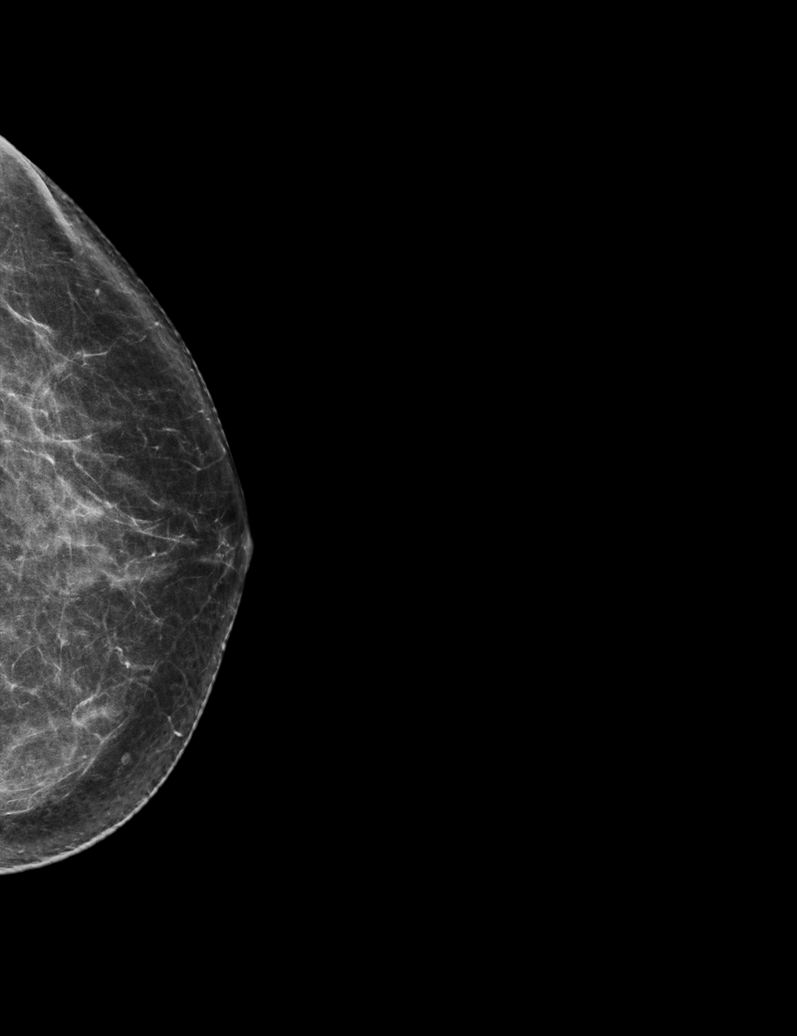

[L MLO synth-2D (2 of 2)]
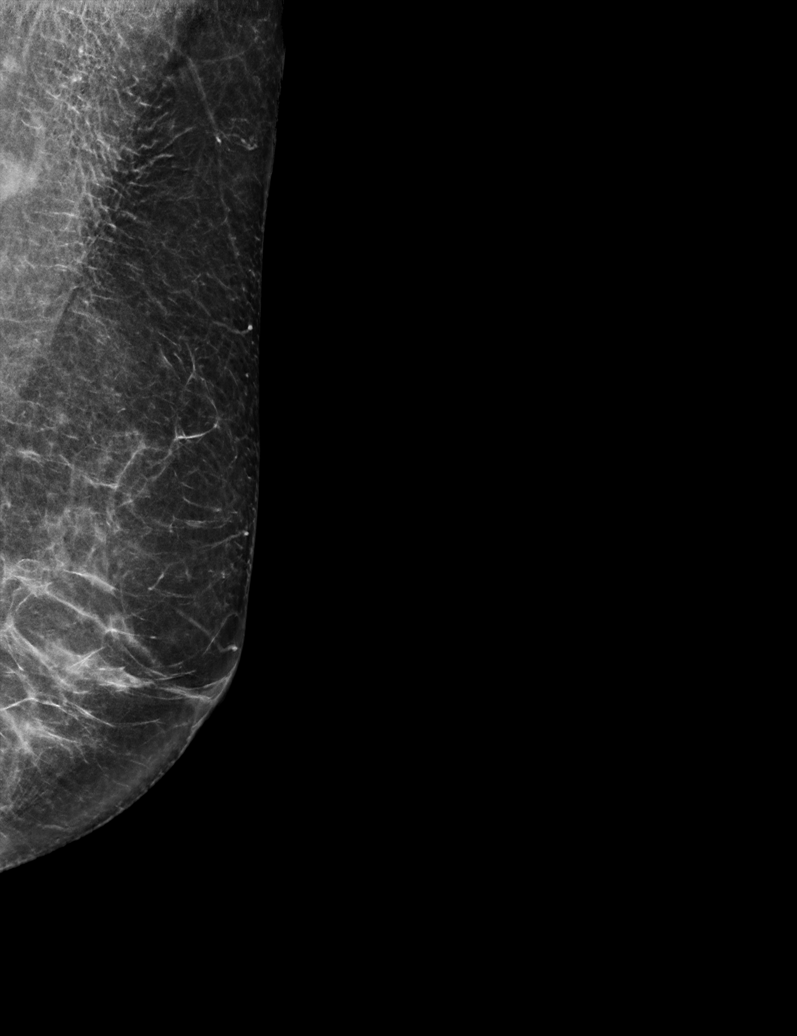

[R CC tomo · tomo slice 35/68.0]
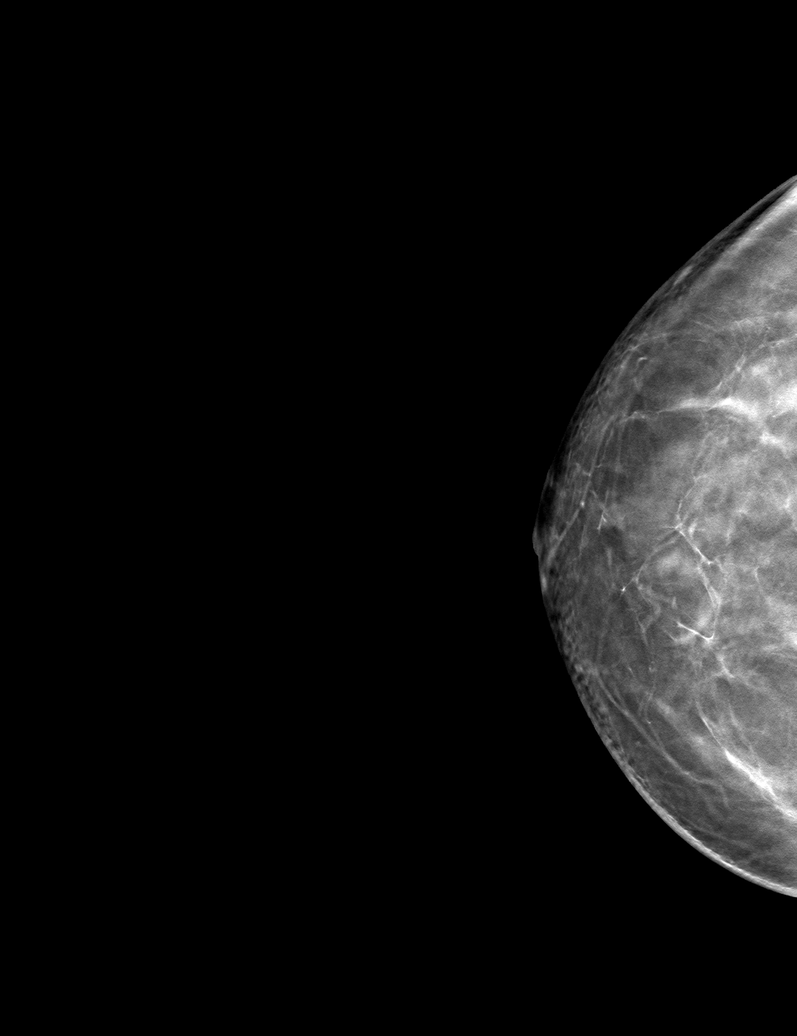

[6 of 30 positions shown; findings below may reference images not displayed]

ACR Breast Density Category c: The breast tissue is heterogeneously
dense, which may obscure small masses.
FINDINGS: In the left breast, a possible asymmetry warrants further
evaluation. In the right breast, no findings suspicious for
malignancy.
IMPRESSION: Further evaluation is suggested for possible asymmetry in the left
breast.

RECOMMENDATION:
Diagnostic mammogram and possibly ultrasound of the left breast.
(Code:[id number])

The patient will be contacted regarding the findings, and additional
imaging will be scheduled.

BI-RADS CATEGORY  0: Incomplete. Need additional imaging evaluation
and/or prior mammograms for comparison.

## 2023-07-25 ENCOUNTER — Encounter (HOSPITAL_COMMUNITY): Payer: Self-pay | Admitting: Psychiatry

## 2023-07-25 ENCOUNTER — Telehealth (INDEPENDENT_AMBULATORY_CARE_PROVIDER_SITE_OTHER): Payer: Medicare Other | Admitting: Psychiatry

## 2023-07-25 DIAGNOSIS — F331 Major depressive disorder, recurrent, moderate: Secondary | ICD-10-CM

## 2023-07-25 MED ORDER — ZOLPIDEM TARTRATE 10 MG PO TABS: 10.0000 mg | ORAL_TABLET | Freq: Every evening | ORAL | 5 refills | Status: DC | PRN

## 2023-07-25 MED ORDER — ESCITALOPRAM OXALATE 20 MG PO TABS: 20.0000 mg | ORAL_TABLET | Freq: Every day | ORAL | 5 refills | Status: DC

## 2023-07-25 MED ORDER — ALPRAZOLAM 1 MG PO TABS: 1.0000 mg | ORAL_TABLET | Freq: Three times a day (TID) | ORAL | 5 refills | Status: DC

## 2023-07-25 NOTE — Progress Notes (Signed)
 Virtual Visit via Telephone Note  I connected with Leah Spencer on 07/25/23 at  1:20 PM EDT by telephone and verified that I am speaking with the correct person using two identifiers.  Location: Patient: home Provider: office   I discussed the limitations, risks, security and privacy concerns of performing an evaluation and management service by telephone and the availability of in person appointments. I also discussed with the patient that there may be a patient responsible charge related to this service. The patient expressed understanding and agreed to proceed.      I discussed the assessment and treatment plan with the patient. The patient was provided an opportunity to ask questions and all were answered. The patient agreed with the plan and demonstrated an understanding of the instructions.   The patient was advised to call back or seek an in-person evaluation if the symptoms worsen or if the condition fails to improve as anticipated.  I provided 20 minutes of non-face-to-face time during this encounter.   Diannia Ruder, MD  [facility] MD/PA/NP OP Progress Note  07/25/2023 1:34 PM Leah Spencer  MRN:  [id number]  Chief Complaint:  Chief Complaint  Patient presents with   Depression   Anxiety   Follow-up   HPI: this patient is a 73 year old divorced white female who lives alone in [place]. She has one daughter and 2 grandchildren. She worked in [employer] most of her life but when out in 2004 on disability due to bulging disc in her back   The patient returns to follow-up after 6 months regarding her depression and anxiety.  Overall she is doing well.  She still has significant back pain and has had several injections.  She is able to keep it at bay with a low dose of pain medication.  She states that her depression and anxiety are well-controlled and she denies thoughts of self-harm or suicide.  She is generally sleeping well.  She gets out occasionally with her sister. Visit  Diagnosis:    ICD-10-CM   1. Major depressive disorder, recurrent episode, moderate (HCC)  F33.1       Past Psychiatric History: Long-term outpatient treatment for depression  Past Medical History:  Past Medical History:  Diagnosis Date   Back pain    Fibromyalgia    Headache    Lyme disease     Past Surgical History:  Procedure Laterality Date   ABDOMINAL HYSTERECTOMY     APPENDECTOMY     BREAST BIOPSY Right    CHOLECYSTECTOMY     right elbow surgery      Family Psychiatric History: See below  Family History:  Family History  Problem Relation Age of Onset   Breast cancer Mother    Depression Mother    Alcohol abuse Father    Depression Sister    Depression Sister    Depression Sister    Depression Maternal Aunt     Social History:  Social History   Socioeconomic History   Marital status: Divorced    Spouse name: Not on file   Number of children: Not on file   Years of education: Not on file   Highest education level: Not on file  Occupational History   Not on file  Tobacco Use   Smoking status: Former   Smokeless tobacco: Never   Tobacco comments:    Quit in 1983  Substance and Sexual Activity   Alcohol use: No   Drug use: No   Sexual activity: Not Currently  Other Topics Concern   Not on file  Social History Narrative   Not on file   Social Drivers of Health   Financial Resource Strain: Not on file  Food Insecurity: Not on file  Transportation Needs: Not on file  Physical Activity: Not on file  Stress: Not on file  Social Connections: Not on file    Allergies:  Allergies  Allergen Reactions   Codeine     Itchy and jittery   Elemental Sulfur     Metabolic Disorder Labs: No results found for: "HGBA1C", "MPG" No results found for: "PROLACTIN" No results found for: "CHOL", "TRIG", "HDL", "CHOLHDL", "VLDL", "LDLCALC" No results found for: "TSH"  Therapeutic Level Labs: No results found for: "LITHIUM" No results found for:  "VALPROATE" No results found for: "CBMZ"  Current Medications: Current Outpatient Medications  Medication Sig Dispense Refill   ALPRAZolam (XANAX) 1 MG tablet Take 1 tablet (1 mg total) by mouth 3 (three) times daily. 90 tablet 5   Cyanocobalamin (VITAMIN B-12 IJ) Inject as directed every 30 (thirty) days.     escitalopram (LEXAPRO) 20 MG tablet Take 1 tablet (20 mg total) by mouth daily. 30 tablet 5   oxyCODONE-acetaminophen (PERCOCET) 7.5-325 MG per tablet Take 1 tablet by mouth 3 (three) times daily as needed.  0   zolpidem (AMBIEN) 10 MG tablet Take 1 tablet (10 mg total) by mouth at bedtime as needed. for sleep 30 tablet 5   No current facility-administered medications for this visit.     Musculoskeletal: Strength & Muscle Tone: na Gait & Station: na Patient leans: N/A  Psychiatric Specialty Exam: Review of Systems  Musculoskeletal:  Positive for back pain.  All other systems reviewed and are negative.   There were no vitals taken for this visit.There is no height or weight on file to calculate BMI.  General Appearance: na  Eye Contact:  NA  Speech:  Clear and Coherent  Volume:  Normal  Mood:  Euthymic  Affect:  NA  Thought Process:  Goal Directed  Orientation:  Full (Time, Place, and Person)  Thought Content: WDL   Suicidal Thoughts:  No  Homicidal Thoughts:  No  Memory:  Immediate;   Good Recent;   Good Remote;   Good  Judgement:  Good  Insight:  Fair  Psychomotor Activity:  Decreased  Concentration:  Concentration: Good and Attention Span: Good  Recall:  Good  Fund of Knowledge: Good  Language: Good  Akathisia:  No  Handed:  Right  AIMS (if indicated): not done  Assets:  Communication Skills Desire for Improvement Resilience Social Support  ADL's:  Intact  Cognition: WNL  Sleep:  Good   Screenings: PHQ2-9    Flowsheet Row Video Visit from 10/12/2021 in [facility] [place] [facility] Video Visit from 06/10/2021 in [facility]patient [facility] at [place] Video Visit from 03/12/2021 in [facility] at [place] Video Visit from 12/11/2020 in [facility] at [place] Video Visit from 09/10/2020 in [facility] Total Score 0 1 1 0 0      Flowsheet Row Video Visit from 10/12/2021 in [facility] at [place] Video Visit from 06/10/2021 in [facility] [place] [facility] Video Visit from 03/12/2021 in [facility] at [place]  C-SSRS RISK CATEGORY No Risk No Risk No Risk        Assessment and Plan: This  patient is a 73 year old female with a history of depression and anxiety.  She continues to do well on her current regimen.  She will continue Lexapro 20 mg daily for depression, Xanax 1 mg up to 3 times daily for anxiety and Ambien 10 mg at bedtime as needed for sleep.  She will return to see me in 6 months  Collaboration of Care: Collaboration of Care: Primary Care Provider AEB notes will be shared with PCP at patient's request  Patient/Guardian was advised Release of Information must be obtained prior to any record release in order to collaborate their care with an outside provider. Patient/Guardian was advised if they have not already done so to contact the registration department to sign all necessary forms in order for us  to release information regarding their care.   Consent: Patient/Guardian gives verbal consent for treatment and assignment of benefits for services provided during this visit. Patient/Guardian expressed understanding and agreed to proceed.    Alfredia Annas, MD 07/25/2023, 1:34 PM

## 2024-01-17 ENCOUNTER — Encounter (HOSPITAL_COMMUNITY): Payer: Self-pay | Admitting: Psychiatry

## 2024-01-17 ENCOUNTER — Telehealth (HOSPITAL_COMMUNITY): Admitting: Psychiatry

## 2024-01-17 DIAGNOSIS — F331 Major depressive disorder, recurrent, moderate: Secondary | ICD-10-CM | POA: Diagnosis not present

## 2024-01-17 MED ORDER — ESCITALOPRAM OXALATE 20 MG PO TABS: 20.0000 mg | ORAL_TABLET | Freq: Every day | ORAL | 5 refills | Status: AC

## 2024-01-17 MED ORDER — ALPRAZOLAM 1 MG PO TABS: 1.0000 mg | ORAL_TABLET | Freq: Three times a day (TID) | ORAL | 5 refills | Status: AC

## 2024-01-17 MED ORDER — ZOLPIDEM TARTRATE 10 MG PO TABS: 10.0000 mg | ORAL_TABLET | Freq: Every evening | ORAL | 5 refills | Status: AC | PRN

## 2024-01-17 NOTE — Progress Notes (Signed)
 Virtual Visit via Telephone Note  I connected with Leah Spencer on 01/17/24 at  1:20 PM EDT by telephone and verified that I am speaking with the correct person using two identifiers.  Location: Patient: home Provider: office   I discussed the limitations, risks, security and privacy concerns of performing an evaluation and management service by telephone and the availability of in person appointments. I also discussed with the patient that there may be a patient responsible charge related to this service. The patient expressed understanding and agreed to proceed.       I discussed the assessment and treatment plan with the patient. The patient was provided an opportunity to ask questions and all were answered. The patient agreed with the plan and demonstrated an understanding of the instructions.   The patient was advised to call back or seek an in-person evaluation if the symptoms worsen or if the condition fails to improve as anticipated.  I provided 20 minutes of non-face-to-face time during this encounter.   Barnie Gull, MD  [facility] MD/PA/NP OP Progress Note  01/17/2024 1:41 PM Leah Spencer  MRN:  [id number]  Chief Complaint:  Chief Complaint  Patient presents with   Depression   Anxiety   Follow-up   HPI:  this patient is a 73 year old divorced white female who lives alone in [place]. She has one daughter and 2 grandchildren. She worked in [employer] most of her life but when out in 2004 on disability due to bulging disc in her back   The patient returns for follow-up after 6 months regarding her depression and anxiety.  She states she is generally doing well and her mood has been good.  She denies significant depression anxiety or thoughts of self-harm.  She is still dealing with back pain and the spinal injections have not really helped.  She occasionally uses oxycodone but knows not to combine it with Xanax .  She is generally sleeping fairly well with the Ambien . Visit  Diagnosis:    ICD-10-CM   1. Major depressive disorder, recurrent episode, moderate (HCC)  F33.1       Past Psychiatric History: Long-term outpatient treatment for depression  Past Medical History:  Past Medical History:  Diagnosis Date   Back pain    Fibromyalgia    Headache    Lyme disease     Past Surgical History:  Procedure Laterality Date   ABDOMINAL HYSTERECTOMY     APPENDECTOMY     BREAST BIOPSY Right    CHOLECYSTECTOMY     right elbow surgery      Family Psychiatric History: See below  Family History:  Family History  Problem Relation Age of Onset   Breast cancer Mother    Depression Mother    Alcohol abuse Father    Depression Sister    Depression Sister    Depression Sister    Depression Maternal Aunt     Social History:  Social History   Socioeconomic History   Marital status: Divorced    Spouse name: Not on file   Number of children: Not on file   Years of education: Not on file   Highest education level: Not on file  Occupational History   Not on file  Tobacco Use   Smoking status: Former   Smokeless tobacco: Never   Tobacco comments:    Quit in 1983  Substance and Sexual Activity   Alcohol use: No   Drug use: No   Sexual activity: Not Currently  Other Topics Concern   Not on file  Social History Narrative   Not on file   Social Drivers of Health   Financial Resource Strain: Not on file  Food Insecurity: Not on file  Transportation Needs: Not on file  Physical Activity: Not on file  Stress: Not on file  Social Connections: Not on file    Allergies:  Allergies  Allergen Reactions   Codeine     Itchy and jittery   Elemental Sulfur     Metabolic Disorder Labs: No results found for: HGBA1C, MPG No results found for: PROLACTIN No results found for: CHOL, TRIG, HDL, CHOLHDL, VLDL, LDLCALC No results found for: TSH  Therapeutic Level Labs: No results found for: LITHIUM No results found for:  VALPROATE No results found for: CBMZ  Current Medications: Current Outpatient Medications  Medication Sig Dispense Refill   ALPRAZolam  (XANAX ) 1 MG tablet Take 1 tablet (1 mg total) by mouth 3 (three) times daily. 90 tablet 5   Cyanocobalamin (VITAMIN B-12 IJ) Inject as directed every 30 (thirty) days.     escitalopram  (LEXAPRO ) 20 MG tablet Take 1 tablet (20 mg total) by mouth daily. 30 tablet 5   oxyCODONE-acetaminophen (PERCOCET) 7.5-325 MG per tablet Take 1 tablet by mouth 3 (three) times daily as needed.  0   zolpidem  (AMBIEN ) 10 MG tablet Take 1 tablet (10 mg total) by mouth at bedtime as needed. for sleep 30 tablet 5   No current facility-administered medications for this visit.     Musculoskeletal: Strength & Muscle Tone: na Gait & Station: na Patient leans: N/A Psychiatric Specialty Exam: Review of Systems  Musculoskeletal:  Positive for arthralgias, back pain and myalgias.  All other systems reviewed and are negative.   There were no vitals taken for this visit.There is no height or weight on file to calculate BMI.  General Appearance: na  Eye Contact:  NA  Speech: Normal  Volume:  Normal  Mood:  Euthymic  Affect:  NA  Thought Process:  Goal Directed  Orientation:  Full (Time, Place, and Person)  Thought Content: WDL   Suicidal Thoughts:  No  Homicidal Thoughts:  No  Memory:  Immediate;   Good Recent;   Good Remote;   Good  Judgement:  Good  Insight:  Fair  Psychomotor Activity:  Decreased  Concentration:  Concentration: Good and Attention Span: Good  Recall:  Good  Fund of Knowledge: Good  Language: Good  Akathisia:  No  Handed:  Right  AIMS (if indicated): not done  Assets:  Communication Skills Desire for Improvement Resilience Social Support  ADL's:  Intact  Cognition: WNL  Sleep:  Good   Screenings: PHQ2-9    Flowsheet Row Video Visit from 10/12/2021 in [facility] at [place] Video Visit from 06/10/2021 in  [facility] at [place] Video Visit from 03/12/2021 in [facility] at [place] Video Visit from 12/11/2020 in [facility] at [place] Video Visit from 09/10/2020 in [facility] at [facility] Total Score 0 1 1 0 0   Flowsheet Row Video Visit from 10/12/2021 in [facility] at [place] Video Visit from 06/10/2021 in [facility] at [place] Video Visit from 03/12/2021 in [facility] Outpatient Behavioral Health at [place]  C-SSRS RISK CATEGORY No Risk No Risk No Risk     Assessment and Plan:  This patient is a 73 year old female with  a history of depression and anxiety.  She continues to do well on her current regimen.  She will continue Lexapro  20 mg daily for major depression, Xanax  1 mg up to 3 times daily for anxiety and Ambien  10 mg at bedtime as needed for sleep.  She will return to see me in 6 months Collaboration of Care: Collaboration of Care: Primary Care Provider AEB notes will be shared with PCP at patient's request  Patient/Guardian was advised Release of Information must be obtained prior to any record release in order to collaborate their care with an outside provider. Patient/Guardian was advised if they have not already done so to contact the registration department to sign all necessary forms in order for us  to release information regarding their care.   Consent: Patient/Guardian gives verbal consent for treatment and assignment of benefits for services provided during this visit. Patient/Guardian expressed understanding and agreed to proceed.    Barnie Gull, MD 01/17/2024, 1:41 PM

## 2024-01-24 ENCOUNTER — Telehealth (HOSPITAL_COMMUNITY): Admitting: Psychiatry

## 2024-07-17 ENCOUNTER — Telehealth (HOSPITAL_COMMUNITY): Admitting: Psychiatry
